# Patient Record
Sex: Female | Born: 1990
Health system: Southern US, Community
[De-identification: ages and names within clinical notes are randomized; demographics above are authoritative.]

## PROBLEM LIST (undated history)

## (undated) ENCOUNTER — Inpatient Hospital Stay (HOSPITAL_COMMUNITY): Payer: Self-pay

## (undated) DIAGNOSIS — R7303 Prediabetes: Secondary | ICD-10-CM

## (undated) DIAGNOSIS — IMO0001 Reserved for inherently not codable concepts without codable children: Secondary | ICD-10-CM

## (undated) DIAGNOSIS — Z789 Other specified health status: Secondary | ICD-10-CM

## (undated) HISTORY — DX: Prediabetes: R73.03

## (undated) HISTORY — PX: NO PAST SURGERIES: SHX2092

---

## 2010-06-05 ENCOUNTER — Inpatient Hospital Stay (HOSPITAL_COMMUNITY)
Admission: AD | Admit: 2010-06-05 | Discharge: 2010-06-08 | Payer: Self-pay | Source: Home / Self Care | Admitting: Obstetrics

## 2010-10-31 LAB — CBC
Hemoglobin: 11 g/dL — ABNORMAL LOW (ref 12.0–15.0)
Platelets: 239 10*3/uL (ref 150–400)
Platelets: 310 10*3/uL (ref 150–400)
RBC: 3.64 MIL/uL — ABNORMAL LOW (ref 3.87–5.11)
RBC: 4.36 MIL/uL (ref 3.87–5.11)
RDW: 15.3 % (ref 11.5–15.5)
WBC: 9.7 10*3/uL (ref 4.0–10.5)

## 2010-10-31 LAB — COMPREHENSIVE METABOLIC PANEL
Alkaline Phosphatase: 339 U/L — ABNORMAL HIGH (ref 39–117)
BUN: 12 mg/dL (ref 6–23)
CO2: 21 mEq/L (ref 19–32)
Chloride: 108 mEq/L (ref 96–112)
Creatinine, Ser: 0.59 mg/dL (ref 0.4–1.2)
GFR calc non Af Amer: >60 mL/min (ref >60)
Glucose, Bld: 81 mg/dL (ref 70–99)
Total Bilirubin: 0.6 mg/dL (ref 0.3–1.2)

## 2010-10-31 LAB — RPR: RPR Ser Ql: NONREACTIVE

## 2010-10-31 LAB — ABO/RH: ABO/RH(D): O POS

## 2012-01-21 ENCOUNTER — Encounter (HOSPITAL_COMMUNITY): Payer: Self-pay | Admitting: Emergency Medicine

## 2012-01-21 ENCOUNTER — Emergency Department (HOSPITAL_COMMUNITY)
Admission: EM | Admit: 2012-01-21 | Discharge: 2012-01-22 | Disposition: A | Discharge status: Home Health | Payer: Self-pay | Attending: Emergency Medicine | Admitting: Emergency Medicine

## 2012-01-21 DIAGNOSIS — R1013 Epigastric pain: Secondary | ICD-10-CM

## 2012-01-21 DIAGNOSIS — R109 Unspecified abdominal pain: Secondary | ICD-10-CM | POA: Insufficient documentation

## 2012-01-21 DIAGNOSIS — IMO0001 Reserved for inherently not codable concepts without codable children: Secondary | ICD-10-CM | POA: Insufficient documentation

## 2012-01-21 DIAGNOSIS — M549 Dorsalgia, unspecified: Secondary | ICD-10-CM | POA: Insufficient documentation

## 2012-01-21 HISTORY — DX: Reserved for inherently not codable concepts without codable children: IMO0001

## 2012-01-21 LAB — URINALYSIS, ROUTINE W REFLEX MICROSCOPIC
Glucose, UA: NEGATIVE mg/dL
Ketones, ur: 15 mg/dL — AB
Nitrite: NEGATIVE
Protein, ur: NEGATIVE mg/dL
pH: 6.5 (ref 5.0–8.0)

## 2012-01-21 NOTE — ED Notes (Signed)
Patient complaining of generalized abdominal pain that radiates around her lower back.  Denies nausea, vomiting, and diarrhea.  Denies changes in urine.

## 2012-01-22 LAB — CBC
HCT: 35.3 % — ABNORMAL LOW (ref 36.0–46.0)
Hemoglobin: 11.7 g/dL — ABNORMAL LOW (ref 12.0–15.0)
MCHC: 33.1 g/dL (ref 30.0–36.0)
RDW: 13.9 % (ref 11.5–15.5)
WBC: 9.4 10*3/uL (ref 4.0–10.5)

## 2012-01-22 LAB — DIFFERENTIAL
Basophils Absolute: 0.1 10*3/uL (ref 0.0–0.1)
Basophils Relative: 1 % (ref 0–1)
Lymphocytes Relative: 36 % (ref 12–46)
Monocytes Absolute: 0.6 10*3/uL (ref 0.1–1.0)
Neutro Abs: 5.2 10*3/uL (ref 1.7–7.7)
Neutrophils Relative %: 56 % (ref 43–77)

## 2012-01-22 LAB — COMPREHENSIVE METABOLIC PANEL
ALT: 22 U/L (ref 0–35)
AST: 24 U/L (ref 0–37)
Albumin: 3.4 g/dL — ABNORMAL LOW (ref 3.5–5.2)
Alkaline Phosphatase: 89 U/L (ref 39–117)
CO2: 24 mEq/L (ref 19–32)
Chloride: 103 mEq/L (ref 96–112)
Creatinine, Ser: 0.53 mg/dL (ref 0.50–1.10)
GFR calc non Af Amer: >90 mL/min (ref >90)
Potassium: 3.7 mEq/L (ref 3.5–5.1)
Total Bilirubin: 0.3 mg/dL (ref 0.3–1.2)

## 2012-01-22 MED ORDER — PANTOPRAZOLE SODIUM 40 MG PO TBEC
40.0000 mg | DELAYED_RELEASE_TABLET | Freq: Once | ORAL | Status: AC
  Administered 2012-01-22: 40 mg via ORAL
  Filled 2012-01-22: qty 1

## 2012-01-22 MED ORDER — GI COCKTAIL ~~LOC~~
30.0000 mL | Freq: Once | ORAL | Status: AC
  Administered 2012-01-22: 30 mL via ORAL
  Filled 2012-01-22: qty 30

## 2012-01-22 MED ORDER — FAMOTIDINE 40 MG PO TABS: 40.0000 mg | ORAL_TABLET | Freq: Every day | ORAL | Status: DC

## 2012-01-22 MED ORDER — FAMOTIDINE 20 MG PO TABS
20.0000 mg | ORAL_TABLET | Freq: Once | ORAL | Status: AC
  Administered 2012-01-22: 20 mg via ORAL
  Filled 2012-01-22: qty 1

## 2012-01-22 NOTE — ED Notes (Signed)
Pt discharged in stable condition with spouse. Discharge instructions reviewed including prescription and importance of making a follow up appt with GI Dr. Rock Nephew diet restrictions also reviewed. Pt questions answered, pt verbalizes understanding of instructions.

## 2012-01-22 NOTE — ED Provider Notes (Signed)
History     CSN: [id number]  Arrival date & time 01/21/12  2321   First MD Initiated Contact with Patient 01/22/12 0015      Chief Complaint  Patient presents with  . Abdominal Pain  . Back Pain    (Consider location/radiation/quality/duration/timing/severity/associated sxs/prior treatment) HPI History provided by patient and her husband bedside. Epigastric pain. Started tonight around 10 PM. She had dinner around 5 PM and does not feel like food was related to her symptoms. Pain is sharp in quality and severe initially some radiation to her mid back. No right upper quadrant pain. No nausea vomiting or diarrhea. No fevers or chills. States she's had this pain before but has not been evaluated for it. No trauma. No belching. Does have some heartburn. No history of gallstones or abdominal surgeries. On my evaluation is feeling significantly better with mild pain. No known aggravating or alleviating factors. Past Medical History  Diagnosis Date  . No significant past medical history     History reviewed. No pertinent past surgical history.  History reviewed. No pertinent family history.  History  Substance Use Topics  . Smoking status: Never Smoker   . Smokeless tobacco: Not on file  . Alcohol Use: No    OB History    Grav Para Term Preterm Abortions TAB SAB Ect Mult Living                  Review of Systems  Constitutional: Negative for fever and chills.  HENT: Negative for neck pain and neck stiffness.   Eyes: Negative for pain.  Respiratory: Negative for shortness of breath.   Cardiovascular: Negative for chest pain.  Gastrointestinal: Positive for abdominal pain. Negative for diarrhea, constipation and blood in stool.  Genitourinary: Negative for dysuria, flank pain, vaginal bleeding and vaginal discharge.  Musculoskeletal: Negative for back pain.  Skin: Negative for rash.  Neurological: Negative for headaches.  All other systems reviewed and are  negative.    Allergies  Review of patient's allergies indicates no known allergies.  Home Medications  No current outpatient prescriptions on file.  BP 93/63  Pulse 89  Temp(Src) 97.9 F (36.6 C) (Oral)  Resp 18  SpO2 99%  Physical Exam  Constitutional: She is oriented to person, place, and time. She appears well-developed and well-nourished.  HENT:  Head: Normocephalic and atraumatic.  Eyes: Conjunctivae and EOM are normal. Pupils are equal, round, and reactive to light.  Neck: Trachea normal. Neck supple. No thyromegaly present.  Cardiovascular: Normal rate, regular rhythm, S1 normal, S2 normal and normal pulses.     No systolic murmur is present   No diastolic murmur is present  Pulses:      Radial pulses are 2+ on the right side, and 2+ on the left side.  Pulmonary/Chest: Effort normal and breath sounds normal. She has no wheezes. She has no rhonchi. She has no rales. She exhibits no tenderness.  Abdominal: Soft. Normal appearance and bowel sounds are normal. There is no tenderness. There is no rebound, no guarding, no CVA tenderness and negative Murphy's sign.       Localizes discomfort epigastric region without reproducible tenderness. No peritonitis  Musculoskeletal:       BLE:s Calves nontender, no cords or erythema, negative Homans sign  Neurological: She is alert and oriented to person, place, and time. She has normal strength. No cranial nerve deficit or sensory deficit. GCS eye subscore is 4. GCS verbal subscore is 5. GCS motor subscore is 6.  Skin: Skin is warm and dry. No rash noted. She is not diaphoretic.  Psychiatric: Her speech is normal.       Cooperative and appropriate    ED Course  Procedures (including critical care time)  Labs Reviewed  URINALYSIS, ROUTINE W REFLEX MICROSCOPIC - Abnormal; Notable for the following:    Color, Urine AMBER (*) BIOCHEMICALS MAY BE AFFECTED BY COLOR   APPearance CLOUDY (*)    Bilirubin Urine SMALL (*)    Ketones, ur  15 (*)    Urobilinogen, UA 2.0 (*)    Leukocytes, UA MODERATE (*)    All other components within normal limits  CBC - Abnormal; Notable for the following:    Hemoglobin 11.7 (*)    HCT 35.3 (*)    All other components within normal limits  COMPREHENSIVE METABOLIC PANEL - Abnormal; Notable for the following:    Glucose, Bld 126 (*)    Albumin 3.4 (*)    All other components within normal limits  URINE MICROSCOPIC-ADD ON - Abnormal; Notable for the following:    Squamous Epithelial / LPF MANY (*)    Bacteria, UA MANY (*)    All other components within normal limits  DIFFERENTIAL  POCT PREGNANCY, URINE    Symptoms suggest gastritis. Doubt acute cholecystitis. UA and labs obtained and reviewed as above. GI cocktail, Pepcid Protonix provided and on recheck at 1:45 AM is feeling significantly better without pain. Serial Donald exams without right upper quadrant tenderness. Negative Murphy sign. No indication for emergent ultrasound to evaluate gallbladder no indication for imaging at this time.  MDM   Nurse's notes reviewed - Patient denies any generalized abdominal pain to me.   Vital signs reviewed. Labs and UA as above. Medications provided with significant relief of symptoms. Plan discharge home with Pepcid and GERD precautions. GI referral provided as needed for any persistent symptoms despite medications.        Sunnie Nielsen, MD 01/22/12 [phone]

## 2012-01-22 NOTE — Discharge Instructions (Signed)
Reflujo gastroesofgico - Adultos  (Gastroesophageal Reflux Disease, Adult)  El reflujo gastroesofgico ocurre cuando el cido del estmago pasa alesfago. El cido produce una sensacin de ardor en el pecho. Con el tiempo, el cido producir hacer pequeas llagas (lceras) en el esfago.  CUIDADOS EN EL HOGAR  Consulte a su mdico para obtener ms informacin sobre:  Curator.  Dejar de fumar.  El consumo de alcohol.  Evite las comidas y bebidas que [employer]. Debe evitar:  Cafena y alcohol.  Chocolate.  Mentas.  Ajo y cebolla.  Comidas muy condimentadas.  Ctricos como naranjas, limones o limas.  Alimentos que contengan tomate, como salsas, [place] y pizza.  Alimentos fritos y Lexicographer.  Evite acostarse durante 3 horas antes de ir a la cama o antes de tomar una siesta.  Haga comidas pequeas durante Glass blower/designer de 3 comidas abundantes.  Use ropas sueltas. No use nada apretado alrededor de la cintura.  Levante (eleve) la cabecera de la cama 6 a 8 pulgadas (15 a 20 cm ) con bloques de madera. Usar almohadas extra no ayuda.  Tome slo los medicamentos que le haya indicado el mdico.  No tome aspirina ni ibuprofeno.  SOLICITE AYUDA DE INMEDIATO SI:  Siente dolor en los brazos, el cuello, la [place], los dientes o la espalda.  El dolor empeora o [place].  Tiene malestar estomacal (nuseas), vmitos, o transpira.  Le falta el aire, se siente mareado o se desvanece (se desmaya).  Vomita y el vmito tiene [place], es de [place], [place], negro o es similar a la borra del caf.  [employer], sanguinolentas o negras.  ASEGRESE DE QUE:  Comprende estas instrucciones.  Controlar su enfermedad.  Solicitar ayuda de inmediato si no mejora o si empeora.

## 2012-08-19 NOTE — L&D Delivery Note (Signed)
Attestation of Attending Supervision of Advanced Practitioner (CNM/NP): Evaluation and management procedures were performed by the Advanced Practitioner under my supervision and collaboration. I have reviewed the Advanced Practitioner's note and chart, and I agree with the management and plan.  Tanya Contreras H. 9:24 AM

## 2012-08-19 NOTE — L&D Delivery Note (Signed)
Delivery Note Pt progressed quickly w/ strong urge to push and at 3:42 AM a viable female was delivered via Vaginal, Spontaneous Delivery (Presentation: Left Occiput Anterior).  APGAR: 9, 9; weight: not available at time of note  .   Placenta status: Intact, Spontaneous.  Cord: 3 vessels with the following complications: None.    Anesthesia: Epidural  Episiotomy: None Lacerations: 1st degree;Perineal Suture Repair: 3.0 monocryl Est. Blood Loss (mL): 250  Mom to postpartum.  Baby to nursery-stable. Plans to breast/bottlefeed, undecided about contraception.  Marge Duncans 05/16/2013, 4:07 AM

## 2013-01-25 ENCOUNTER — Encounter (HOSPITAL_COMMUNITY): Payer: Self-pay | Admitting: *Deleted

## 2013-01-25 ENCOUNTER — Inpatient Hospital Stay (HOSPITAL_COMMUNITY)
Admission: AD | Admit: 2013-01-25 | Discharge: 2013-01-25 | Disposition: A | Discharge status: Home / Self Care | Payer: Self-pay | Source: Ambulatory Visit | Attending: Obstetrics & Gynecology | Admitting: Obstetrics & Gynecology

## 2013-01-25 DIAGNOSIS — N939 Abnormal uterine and vaginal bleeding, unspecified: Secondary | ICD-10-CM

## 2013-01-25 DIAGNOSIS — N898 Other specified noninflammatory disorders of vagina: Secondary | ICD-10-CM

## 2013-01-25 DIAGNOSIS — O26859 Spotting complicating pregnancy, unspecified trimester: Secondary | ICD-10-CM | POA: Insufficient documentation

## 2013-01-25 HISTORY — DX: Other specified health status: Z78.9

## 2013-01-25 LAB — OB RESULTS CONSOLE ABO/RH: RH Type: POSITIVE

## 2013-01-25 LAB — OB RESULTS CONSOLE HEPATITIS B SURFACE ANTIGEN: Hepatitis B Surface Ag: NEGATIVE

## 2013-01-25 LAB — URINALYSIS, ROUTINE W REFLEX MICROSCOPIC
Glucose, UA: NEGATIVE mg/dL
Protein, ur: NEGATIVE mg/dL
Specific Gravity, Urine: <1.005 — ABNORMAL LOW (ref 1.005–1.030)
pH: 6.5 (ref 5.0–8.0)

## 2013-01-25 LAB — URINE MICROSCOPIC-ADD ON

## 2013-01-25 LAB — OB RESULTS CONSOLE ANTIBODY SCREEN: Antibody Screen: NEGATIVE

## 2013-01-25 LAB — POCT PREGNANCY, URINE: Preg Test, Ur: POSITIVE — AB

## 2013-01-25 LAB — OB RESULTS CONSOLE RPR: RPR: NONREACTIVE

## 2013-01-25 NOTE — MAU Note (Signed)
Call received from RN at [facility] Department. Patient was being seen for her first appointment today and with the pelvic exam, bleeding was noted. Patient sent to MAU for evaluation.

## 2013-01-25 NOTE — MAU Provider Note (Signed)
  History     CSN: [id number]  Arrival date and time: 01/25/13 1137   First Provider Initiated Contact with Patient 01/25/13 1223      Chief Complaint  Patient presents with  . Vaginal Bleeding   HPI Tanya Contreras is a 22 y.o. G2P1001 at [redacted]w[redacted]d who presents from the health department today with reports of vaginal bleeding on speculum exam.  MAU received a call from the Health Department today stating that they noticed some bleeding during speculum examination and was sending patient over for further evaluation. Patient reports that she noticed a slight/small amount of blood following Pap smear today.  No other complaints at this time.  No loss of fluid, contractions, vaginal discharge.  She states that she is feeling well.  *Of note, this was her initial [facility] visit at the Health department.   Past Medical History  Diagnosis Date  . No significant past medical history   . Medical history non-contributory     Past Surgical History  Procedure Laterality Date  . No past surgeries      No family history on file.  History  Substance Use Topics  . Smoking status: Never Smoker   . Smokeless tobacco: Not on file  . Alcohol Use: No    Allergies: No Known Allergies  Prescriptions prior to admission  Medication Sig Dispense Refill  . Prenatal Vit-Fe Fumarate-FA (PRENATAL MULTIVITAMIN) TABS Take 1 tablet by mouth daily at 12 noon.        ROS Per HPI Physical Exam   Height 5\' 1"  (1.549 m), weight 61.78 kg (136 lb 3.2 oz), last menstrual period 08/15/2012.  Physical Exam Gen: well appearing, NAD. Abd: gravid but otherwise soft, nontender to palpation Ext: no appreciable lower extremity edema bilaterally Neuro: no focal deficits GU: normal appearing external genitalia Speculum examination: Friable cervix.  Minimal, blood tinged mucous noted.  No gross bleeding or pooling.  Cervix closed.      MAU Course  Procedures EFM: FHR 145-150, moderate variability, 10 bpm accels, no  decels Toco: No UCs  Assessment and Plan  Tanya Contreras is a 22 y.o. G2P1001 at [redacted]w[redacted]d who presents with spotting following pap smear this am. - No significant bleeding noted. - Reassured patient that spotting can occur following pap smear.   - Return instructions discussed. - Patient medically stable and will be discharged home.   Everlene Other 01/25/2013, 12:24 PM   Evaluation and management procedures were performed by Resident physician under my supervision/collaboration. Chart reviewed, patient examined by me and I agree with management and plan. TC to [facility] to confirm cultures done and anatomic [place] scheduled as pt unaware. If done will d/c home on pelvic rest until after [place] and bleeding stops. Will schedule outpatient anatomic [place] for this week.  Danae [place], CNM 01/25/2013 12:41 PM

## 2013-01-25 NOTE — MAU Note (Signed)
Pt sent over from health dept went for first exam and they saw bleeding and sent her over to be examiened. Pt did not know she was bleeding and denies any pain.

## 2013-02-24 ENCOUNTER — Other Ambulatory Visit (HOSPITAL_COMMUNITY): Payer: Self-pay | Admitting: *Deleted

## 2013-02-24 DIAGNOSIS — Z1389 Encounter for screening for other disorder: Secondary | ICD-10-CM

## 2013-02-24 DIAGNOSIS — Z363 Encounter for antenatal screening for malformations: Secondary | ICD-10-CM

## 2013-03-03 ENCOUNTER — Ambulatory Visit (HOSPITAL_COMMUNITY)
Admission: RE | Admit: 2013-03-03 | Discharge: 2013-03-03 | Disposition: A | Discharge status: Home / Self Care | Payer: Self-pay | Source: Ambulatory Visit | Attending: Nurse Practitioner | Admitting: Nurse Practitioner

## 2013-03-03 DIAGNOSIS — Z3689 Encounter for other specified antenatal screening: Secondary | ICD-10-CM | POA: Insufficient documentation

## 2013-03-03 DIAGNOSIS — Z1389 Encounter for screening for other disorder: Secondary | ICD-10-CM

## 2013-03-03 DIAGNOSIS — Z363 Encounter for antenatal screening for malformations: Secondary | ICD-10-CM

## 2013-04-26 LAB — OB RESULTS CONSOLE GBS: GBS: NEGATIVE

## 2013-05-15 ENCOUNTER — Inpatient Hospital Stay (HOSPITAL_COMMUNITY)
Admission: AD | Admit: 2013-05-15 | Discharge: 2013-05-17 | DRG: 775 | Disposition: A | Discharge status: Home / Self Care | Payer: Medicaid Other | Source: Ambulatory Visit | Attending: Obstetrics & Gynecology | Admitting: Obstetrics & Gynecology

## 2013-05-15 DIAGNOSIS — IMO0001 Reserved for inherently not codable concepts without codable children: Secondary | ICD-10-CM

## 2013-05-15 NOTE — MAU Note (Signed)
Contractions since 1200n. Some bloody show.

## 2013-05-16 ENCOUNTER — Inpatient Hospital Stay (HOSPITAL_COMMUNITY): Payer: Medicaid Other | Admitting: Anesthesiology

## 2013-05-16 ENCOUNTER — Encounter (HOSPITAL_COMMUNITY): Payer: Self-pay | Admitting: *Deleted

## 2013-05-16 ENCOUNTER — Encounter (HOSPITAL_COMMUNITY): Payer: Self-pay | Admitting: Anesthesiology

## 2013-05-16 LAB — CBC
HCT: 36.9 % (ref 36.0–46.0)
Hemoglobin: 12.4 g/dL (ref 12.0–15.0)
MCHC: 33.6 g/dL (ref 30.0–36.0)
MCV: 82.6 fL (ref 78.0–100.0)
RDW: 15.9 % — ABNORMAL HIGH (ref 11.5–15.5)
WBC: 10.2 10*3/uL (ref 4.0–10.5)

## 2013-05-16 LAB — TYPE AND SCREEN

## 2013-05-16 MED ORDER — SENNOSIDES-DOCUSATE SODIUM 8.6-50 MG PO TABS
2.0000 | ORAL_TABLET | Freq: Every day | ORAL | Status: DC
  Administered 2013-05-16: 2 via ORAL

## 2013-05-16 MED ORDER — OXYTOCIN 40 UNITS IN LACTATED RINGERS INFUSION - SIMPLE MED
62.5000 mL/h | INTRAVENOUS | Status: DC
  Administered 2013-05-16: 62.5 mL/h via INTRAVENOUS
  Filled 2013-05-16: qty 1000

## 2013-05-16 MED ORDER — EPHEDRINE 5 MG/ML INJ
10.0000 mg | INTRAVENOUS | Status: DC | PRN
  Filled 2013-05-16: qty 2
  Filled 2013-05-16: qty 4

## 2013-05-16 MED ORDER — DIBUCAINE 1 % RE OINT: 1.0000 "application " | TOPICAL_OINTMENT | RECTAL | Status: DC | PRN

## 2013-05-16 MED ORDER — FLEET ENEMA 7-19 GM/118ML RE ENEM: 1.0000 | ENEMA | RECTAL | Status: DC | PRN

## 2013-05-16 MED ORDER — ACETAMINOPHEN 325 MG PO TABS: 650.0000 mg | ORAL_TABLET | ORAL | Status: DC | PRN

## 2013-05-16 MED ORDER — FLEET ENEMA 7-19 GM/118ML RE ENEM: 1.0000 | ENEMA | Freq: Every day | RECTAL | Status: DC | PRN

## 2013-05-16 MED ORDER — ONDANSETRON HCL 4 MG/2ML IJ SOLN: 4.0000 mg | Freq: Four times a day (QID) | INTRAMUSCULAR | Status: DC | PRN

## 2013-05-16 MED ORDER — LACTATED RINGERS IV SOLN
INTRAVENOUS | Status: DC
  Administered 2013-05-16 (×2): via INTRAVENOUS

## 2013-05-16 MED ORDER — LACTATED RINGERS IV SOLN: 500.0000 mL | INTRAVENOUS | Status: DC | PRN

## 2013-05-16 MED ORDER — BENZOCAINE-MENTHOL 20-0.5 % EX AERO
1.0000 "application " | INHALATION_SPRAY | CUTANEOUS | Status: DC | PRN
  Administered 2013-05-16: 1 via TOPICAL
  Filled 2013-05-16 (×2): qty 56

## 2013-05-16 MED ORDER — OXYCODONE-ACETAMINOPHEN 5-325 MG PO TABS
1.0000 | ORAL_TABLET | ORAL | Status: DC | PRN
  Filled 2013-05-16 (×4): qty 1

## 2013-05-16 MED ORDER — ONDANSETRON HCL 4 MG/2ML IJ SOLN: 4.0000 mg | INTRAMUSCULAR | Status: DC | PRN

## 2013-05-16 MED ORDER — OXYTOCIN 40 UNITS IN LACTATED RINGERS INFUSION - SIMPLE MED: 62.5000 mL/h | INTRAVENOUS | Status: DC | PRN

## 2013-05-16 MED ORDER — ONDANSETRON HCL 4 MG PO TABS: 4.0000 mg | ORAL_TABLET | ORAL | Status: DC | PRN

## 2013-05-16 MED ORDER — PRENATAL MULTIVITAMIN CH
1.0000 | ORAL_TABLET | Freq: Every day | ORAL | Status: DC
  Administered 2013-05-16 – 2013-05-17 (×2): 1 via ORAL
  Filled 2013-05-16 (×2): qty 1

## 2013-05-16 MED ORDER — OXYTOCIN BOLUS FROM INFUSION: 500.0000 mL | INTRAVENOUS | Status: DC

## 2013-05-16 MED ORDER — FENTANYL CITRATE 0.05 MG/ML IJ SOLN: 100.0000 ug | INTRAMUSCULAR | Status: DC | PRN

## 2013-05-16 MED ORDER — BISACODYL 10 MG RE SUPP: 10.0000 mg | Freq: Every day | RECTAL | Status: DC | PRN

## 2013-05-16 MED ORDER — SIMETHICONE 80 MG PO CHEW: 80.0000 mg | CHEWABLE_TABLET | ORAL | Status: DC | PRN

## 2013-05-16 MED ORDER — LIDOCAINE HCL (PF) 1 % IJ SOLN
INTRAMUSCULAR | Status: DC | PRN
  Administered 2013-05-16 (×3): 5 mL

## 2013-05-16 MED ORDER — IBUPROFEN 600 MG PO TABS
600.0000 mg | ORAL_TABLET | Freq: Four times a day (QID) | ORAL | Status: DC | PRN
  Filled 2013-05-16 (×6): qty 1

## 2013-05-16 MED ORDER — OXYCODONE-ACETAMINOPHEN 5-325 MG PO TABS
1.0000 | ORAL_TABLET | ORAL | Status: DC | PRN
  Administered 2013-05-16 – 2013-05-17 (×4): 1 via ORAL

## 2013-05-16 MED ORDER — SODIUM CHLORIDE 0.9 % IJ SOLN: 3.0000 mL | INTRAMUSCULAR | Status: DC | PRN

## 2013-05-16 MED ORDER — TETANUS-DIPHTH-ACELL PERTUSSIS 5-2.5-18.5 LF-MCG/0.5 IM SUSP: 0.5000 mL | Freq: Once | INTRAMUSCULAR | Status: DC

## 2013-05-16 MED ORDER — LACTATED RINGERS IV SOLN
500.0000 mL | Freq: Once | INTRAVENOUS | Status: AC
  Administered 2013-05-16: 02:00:00 via INTRAVENOUS

## 2013-05-16 MED ORDER — ZOLPIDEM TARTRATE 5 MG PO TABS: 5.0000 mg | ORAL_TABLET | Freq: Every evening | ORAL | Status: DC | PRN

## 2013-05-16 MED ORDER — SODIUM CHLORIDE 0.9 % IV SOLN: 250.0000 mL | INTRAVENOUS | Status: DC | PRN

## 2013-05-16 MED ORDER — PHENYLEPHRINE 40 MCG/ML (10ML) SYRINGE FOR IV PUSH (FOR BLOOD PRESSURE SUPPORT)
80.0000 ug | PREFILLED_SYRINGE | INTRAVENOUS | Status: DC | PRN
  Filled 2013-05-16: qty 5
  Filled 2013-05-16: qty 2

## 2013-05-16 MED ORDER — SODIUM CHLORIDE 0.9 % IJ SOLN: 3.0000 mL | Freq: Two times a day (BID) | INTRAMUSCULAR | Status: DC

## 2013-05-16 MED ORDER — EPHEDRINE 5 MG/ML INJ
10.0000 mg | INTRAVENOUS | Status: DC | PRN
  Filled 2013-05-16: qty 2

## 2013-05-16 MED ORDER — PHENYLEPHRINE 40 MCG/ML (10ML) SYRINGE FOR IV PUSH (FOR BLOOD PRESSURE SUPPORT)
80.0000 ug | PREFILLED_SYRINGE | INTRAVENOUS | Status: DC | PRN
  Filled 2013-05-16: qty 2

## 2013-05-16 MED ORDER — FENTANYL 2.5 MCG/ML BUPIVACAINE 1/10 % EPIDURAL INFUSION (WH - ANES)
INTRAMUSCULAR | Status: DC | PRN
  Administered 2013-05-16: 14 mL/h via EPIDURAL

## 2013-05-16 MED ORDER — IBUPROFEN 600 MG PO TABS
600.0000 mg | ORAL_TABLET | Freq: Four times a day (QID) | ORAL | Status: DC
  Administered 2013-05-16 – 2013-05-17 (×6): 600 mg via ORAL
  Filled 2013-05-16: qty 1

## 2013-05-16 MED ORDER — HYDROXYZINE HCL 50 MG PO TABS
50.0000 mg | ORAL_TABLET | Freq: Four times a day (QID) | ORAL | Status: DC | PRN
  Filled 2013-05-16: qty 1

## 2013-05-16 MED ORDER — LANOLIN HYDROUS EX OINT: TOPICAL_OINTMENT | CUTANEOUS | Status: DC | PRN

## 2013-05-16 MED ORDER — DIPHENHYDRAMINE HCL 50 MG/ML IJ SOLN: 12.5000 mg | INTRAMUSCULAR | Status: DC | PRN

## 2013-05-16 MED ORDER — LIDOCAINE HCL (PF) 1 % IJ SOLN
30.0000 mL | INTRAMUSCULAR | Status: DC | PRN
  Administered 2013-05-16: 30 mL via SUBCUTANEOUS
  Filled 2013-05-16 (×2): qty 30

## 2013-05-16 MED ORDER — WITCH HAZEL-GLYCERIN EX PADS: 1.0000 "application " | MEDICATED_PAD | CUTANEOUS | Status: DC | PRN

## 2013-05-16 MED ORDER — DIPHENHYDRAMINE HCL 25 MG PO CAPS: 25.0000 mg | ORAL_CAPSULE | Freq: Four times a day (QID) | ORAL | Status: DC | PRN

## 2013-05-16 MED ORDER — FENTANYL 2.5 MCG/ML BUPIVACAINE 1/10 % EPIDURAL INFUSION (WH - ANES)
14.0000 mL/h | INTRAMUSCULAR | Status: DC | PRN
  Filled 2013-05-16: qty 125

## 2013-05-16 MED ORDER — MEASLES, MUMPS & RUBELLA VAC ~~LOC~~ INJ
0.5000 mL | INJECTION | Freq: Once | SUBCUTANEOUS | Status: DC
  Filled 2013-05-16: qty 0.5

## 2013-05-16 MED ORDER — CITRIC ACID-SODIUM CITRATE 334-500 MG/5ML PO SOLN: 30.0000 mL | ORAL | Status: DC | PRN

## 2013-05-16 NOTE — MAU Note (Signed)
Report called to [facility] RN in [facility] regarding pt's assessment and admission.

## 2013-05-16 NOTE — Progress Notes (Signed)
To BS via w/c °

## 2013-05-16 NOTE — Progress Notes (Signed)
Joellyn Haff CNM on unit and aware of pt's admission and status.

## 2013-05-16 NOTE — Anesthesia Postprocedure Evaluation (Signed)
Anesthesia Post Note  Patient: Tanya Contreras  Procedure(s) Performed: * No procedures listed *  Anesthesia type: Epidural  Patient location: Mother/Baby  Post pain: Pain level controlled  Post assessment: Post-op Vital signs reviewed  Last Vitals:  Filed Vitals:   05/16/13 0800  BP: 100/56  Pulse: 80  Temp: 37.3 C  Resp: 18    Post vital signs: Reviewed  Level of consciousness:alert  Complications: No apparent anesthesia complications

## 2013-05-16 NOTE — H&P (Signed)
Tanya Contreras is a 22 y.o. G2P1001 female at [redacted]w[redacted]d by LMP which correlates well w/ 29wk u/s, presenting for uc's that began at 1200 on 9/27 and have progressively increased in frequency/intensity. SVE by MAU RN 4/80/-1, vtx, had been 1 at HD on last exam. Reports good fm, denies vb or lof.  Initiated pnc at [facility] at 23wks. Too late for genetic screening, anatomy u/s normal, 1hr glucola 87, gbs neg. H/O uncomplicated 39wk SVD in 2011, infant weighed 6lb13oz.   Maternal Medical History:  Reason for admission: Contractions.   Contractions: Onset was 6-12 hours ago.   Frequency: regular.   Perceived severity is strong.    Fetal activity: Perceived fetal activity is normal.   Last perceived fetal movement was within the past hour.    Prenatal complications: no prenatal complications Prenatal Complications - Diabetes: none.    OB History   Grav Para Term Preterm Abortions TAB SAB Ect Mult Living   2 1 1       1      Past Medical History  Diagnosis Date  . No significant past medical history   . Medical history non-contributory    Past Surgical History  Procedure Laterality Date  . No past surgeries     Family History: family history is negative for Alcohol abuse. Social History:  reports that she has never smoked. She does not have any smokeless tobacco history on file. She reports that she does not drink alcohol or use illicit drugs.   Review of Systems  Constitutional: Negative.   HENT: Negative.   Eyes: Negative.   Respiratory: Negative.   Cardiovascular: Negative.   Gastrointestinal: Positive for abdominal pain (uc's).  Genitourinary: Negative.   Musculoskeletal: Negative.   Skin: Negative.   Neurological: Negative.   Endo/Heme/Allergies: Negative.   Psychiatric/Behavioral: Negative.     Dilation: 4 Effacement (%): 80 Station: -1 Exam by:: Quintella Baton RNC Blood pressure 107/65, pulse 73, temperature 97.4 F (36.3 C), resp. rate 20, height 5' (1.524 m), weight 69.763 kg  (153 lb 12.8 oz), last menstrual period 08/15/2012. Maternal Exam:  Uterine Assessment: Contraction strength is moderate.  Contraction frequency is regular.   Abdomen: Fetal presentation: vertex     Fetal Exam Fetal Monitor Review: Mode: ultrasound.   Baseline rate: 125.  Variability: moderate (6-25 bpm).   Pattern: accelerations present and no decelerations.    Fetal State Assessment: Category I - tracings are normal.     Physical Exam  Constitutional: She is oriented to person, place, and time. She appears well-developed and well-nourished.  HENT:  Head: Normocephalic.  Neck: Normal range of motion.  Cardiovascular: Normal rate and regular rhythm.   Respiratory: Effort normal and breath sounds normal.  GI: Soft. There is no tenderness.  gravid  Genitourinary:  SVe by MAU RN 4/80/-1, vtx        had been 1 at HD  Musculoskeletal: Normal range of motion.  Neurological: She is alert and oriented to person, place, and time. She has normal reflexes.  Skin: Skin is warm and dry.  Psychiatric: She has a normal mood and affect. Her behavior is normal. Judgment and thought content normal.    Prenatal labs: ABO, Rh: O/Positive/-- (06/09 0000) Antibody: Negative (06/09 0000) Rubella: Immune (06/09 0000) RPR: Nonreactive (06/09 0000)  HBsAg: Negative (06/09 0000)  HIV: Non-reactive (06/09 0000)  GBS: Negative (09/08 0000)   Assessment/Plan: A:  [redacted]w[redacted]d SIUP  G2P1001   Spontaneous labor  Cat I FHR  GBS neg  P:  Admit to BS  IV pain meds/epidural prn   Expectant management  Anticipate NSVD   Marge Duncans 05/16/2013, 12:40 AM

## 2013-05-16 NOTE — Anesthesia Preprocedure Evaluation (Signed)

## 2013-05-16 NOTE — H&P (Signed)
Attestation of Attending Supervision of Advanced Practitioner (CNM/NP): Evaluation and management procedures were performed by the Advanced Practitioner under my supervision and collaboration. I have reviewed the Advanced Practitioner's note and chart, and I agree with the management and plan.  Axell Trigueros H. 7:43 AM   

## 2013-05-16 NOTE — Anesthesia Procedure Notes (Signed)
Epidural Patient location during procedure: OB  Staffing Anesthesiologist: Kyeisha Janowicz Performed by: anesthesiologist   Preanesthetic Checklist Completed: patient identified, site marked, surgical consent, pre-op evaluation, timeout performed, IV checked, risks and benefits discussed and monitors and equipment checked  Epidural Patient position: sitting Prep: ChloraPrep Patient monitoring: heart rate, continuous pulse ox and blood pressure Approach: right paramedian Injection technique: LOR saline  Needle:  Needle type: Tuohy  Needle gauge: 17 G Needle length: 9 cm and 9 Needle insertion depth: 6 cm Catheter type: closed end flexible Catheter size: 20 Guage Catheter at skin depth: 12 cm Test dose: negative  Assessment Events: blood not aspirated, injection not painful, no injection resistance, negative IV test and no paresthesia  Additional Notes   Patient tolerated the insertion well without complications.   

## 2013-05-17 MED ORDER — INFLUENZA VAC SPLIT QUAD 0.5 ML IM SUSP
0.5000 mL | INTRAMUSCULAR | Status: AC
  Administered 2013-05-17: 0.5 mL via INTRAMUSCULAR
  Filled 2013-05-17: qty 0.5

## 2013-05-17 MED ORDER — IBUPROFEN 600 MG PO TABS: 600.0000 mg | ORAL_TABLET | Freq: Four times a day (QID) | ORAL | Status: DC

## 2013-05-17 NOTE — Progress Notes (Signed)
Ur chart review completed.  

## 2013-05-17 NOTE — Discharge Summary (Signed)
Obstetric Discharge Summary Reason for Admission: onset of labor Prenatal Procedures: ultrasound Intrapartum Procedures: spontaneous vaginal delivery Postpartum Procedures: none Complications-Operative and Postpartum: 1st degree perineal laceration Hemoglobin  Date Value Range Status  05/16/2013 12.4  12.0 - 15.0 g/dL Final     HCT  Date Value Range Status  05/16/2013 36.9  36.0 - 46.0 % Final   Ms. Dano was admitted for spontaneous onset of labor. Her labor course was uncomplicated. She received an epidural. Delivery of a viable female with complications of a 1st degree perineal lacerate which was repaired. Postpartum course has been uncomplicated. She is being discharged home on PNVs and ibuprofen.  Physical Exam:  General: alert, cooperative and no distress Lochia: appropriate Uterine Fundus: firm Incision: n/a DVT Evaluation: No evidence of DVT seen on physical exam.  Discharge Diagnoses: Term Pregnancy-delivered  Discharge Information: Date: 05/17/2013 Activity: pelvic rest Diet: routine Medications: PNV and Ibuprofen Condition: stable Instructions: refer to practice specific booklet Discharge to: home   Newborn Data: Live born female  Birth Weight: 7 lb 10 oz (3459 g) APGAR: 9, 9  Home with mother.  Jacquelin Hawking 05/17/2013, 10:46 AM  I have seen and examined this patient and agree with above documentation in the resident's note. Pt is breast and bottle feeding.    Rulon Abide, M.D. [facility] Fellow 05/17/2013 1:23 PM

## 2013-05-17 NOTE — Discharge Summary (Signed)
Attestation of Attending Supervision of Fellow: Evaluation and management procedures were performed by the Fellow under my supervision and collaboration.  I have reviewed the Fellow's note and chart, and I agree with the management and plan.    

## 2014-06-20 ENCOUNTER — Encounter (HOSPITAL_COMMUNITY): Payer: Self-pay | Admitting: *Deleted

## 2014-08-04 ENCOUNTER — Emergency Department (HOSPITAL_COMMUNITY): Payer: Self-pay

## 2014-08-04 ENCOUNTER — Emergency Department (HOSPITAL_COMMUNITY)
Admission: EM | Admit: 2014-08-04 | Discharge: 2014-08-05 | Disposition: A | Discharge status: Home / Self Care | Payer: Self-pay | Attending: Emergency Medicine | Admitting: Emergency Medicine

## 2014-08-04 DIAGNOSIS — K8020 Calculus of gallbladder without cholecystitis without obstruction: Secondary | ICD-10-CM | POA: Insufficient documentation

## 2014-08-04 DIAGNOSIS — R1010 Upper abdominal pain, unspecified: Secondary | ICD-10-CM

## 2014-08-04 DIAGNOSIS — R1011 Right upper quadrant pain: Secondary | ICD-10-CM | POA: Insufficient documentation

## 2014-08-04 DIAGNOSIS — Z3202 Encounter for pregnancy test, result negative: Secondary | ICD-10-CM | POA: Insufficient documentation

## 2014-08-04 DIAGNOSIS — Z79899 Other long term (current) drug therapy: Secondary | ICD-10-CM | POA: Insufficient documentation

## 2014-08-04 LAB — COMPREHENSIVE METABOLIC PANEL
ALBUMIN: 4.1 g/dL (ref 3.5–5.2)
ALT: 25 U/L (ref 0–35)
ANION GAP: 14 (ref 5–15)
AST: 34 U/L (ref 0–37)
Alkaline Phosphatase: 114 U/L (ref 39–117)
BUN: 15 mg/dL (ref 6–23)
CALCIUM: 9.8 mg/dL (ref 8.4–10.5)
CO2: 25 mEq/L (ref 19–32)
CREATININE: 0.55 mg/dL (ref 0.50–1.10)
Chloride: 102 mEq/L (ref 96–112)
GFR calc Af Amer: >90 mL/min (ref >90)
GFR calc non Af Amer: >90 mL/min (ref >90)
Glucose, Bld: 104 mg/dL — ABNORMAL HIGH (ref 70–99)
Potassium: 3.9 mEq/L (ref 3.7–5.3)
Sodium: 141 mEq/L (ref 137–147)
Total Bilirubin: <0.2 mg/dL — ABNORMAL LOW (ref 0.3–1.2)
Total Protein: 8 g/dL (ref 6.0–8.3)

## 2014-08-04 LAB — URINALYSIS, ROUTINE W REFLEX MICROSCOPIC
Bilirubin Urine: NEGATIVE
Glucose, UA: NEGATIVE mg/dL
HGB URINE DIPSTICK: NEGATIVE
Ketones, ur: NEGATIVE mg/dL
Nitrite: NEGATIVE
PH: 6 (ref 5.0–8.0)
Protein, ur: NEGATIVE mg/dL
SPECIFIC GRAVITY, URINE: 1.026 (ref 1.005–1.030)
UROBILINOGEN UA: 0.2 mg/dL (ref 0.0–1.0)

## 2014-08-04 LAB — CBC WITH DIFFERENTIAL/PLATELET
Basophils Absolute: 0 10*3/uL (ref 0.0–0.1)
Basophils Relative: 0 % (ref 0–1)
Eosinophils Absolute: 0.2 10*3/uL (ref 0.0–0.7)
Eosinophils Relative: 2 % (ref 0–5)
HEMATOCRIT: 38.6 % (ref 36.0–46.0)
HEMOGLOBIN: 12.9 g/dL (ref 12.0–15.0)
LYMPHS ABS: 5.2 10*3/uL — AB (ref 0.7–4.0)
LYMPHS PCT: 47 % — AB (ref 12–46)
MCH: 29.5 pg (ref 26.0–34.0)
MCHC: 33.4 g/dL (ref 30.0–36.0)
MCV: 88.3 fL (ref 78.0–100.0)
MONO ABS: 0.8 10*3/uL (ref 0.1–1.0)
MONOS PCT: 7 % (ref 3–12)
NEUTROS ABS: 4.9 10*3/uL (ref 1.7–7.7)
Neutrophils Relative %: 44 % (ref 43–77)
Platelets: 350 10*3/uL (ref 150–400)
RBC: 4.37 MIL/uL (ref 3.87–5.11)
RDW: 13.9 % (ref 11.5–15.5)
WBC: 11.1 10*3/uL — AB (ref 4.0–10.5)

## 2014-08-04 LAB — URINE MICROSCOPIC-ADD ON

## 2014-08-04 LAB — LIPASE, BLOOD: Lipase: 66 U/L — ABNORMAL HIGH (ref 11–59)

## 2014-08-04 LAB — PREGNANCY, URINE: PREG TEST UR: NEGATIVE

## 2014-08-04 MED ORDER — MORPHINE SULFATE 4 MG/ML IJ SOLN
4.0000 mg | Freq: Once | INTRAMUSCULAR | Status: AC
  Administered 2014-08-04: 4 mg via INTRAVENOUS
  Filled 2014-08-04: qty 1

## 2014-08-04 MED ORDER — ONDANSETRON HCL 4 MG/2ML IJ SOLN
4.0000 mg | Freq: Once | INTRAMUSCULAR | Status: AC
  Administered 2014-08-04: 4 mg via INTRAVENOUS
  Filled 2014-08-04: qty 2

## 2014-08-04 MED ORDER — SODIUM CHLORIDE 0.9 % IV BOLUS (SEPSIS)
1000.0000 mL | Freq: Once | INTRAVENOUS | Status: AC
  Administered 2014-08-04: 1000 mL via INTRAVENOUS

## 2014-08-04 NOTE — ED Provider Notes (Signed)
CSN: [id number]     Arrival date & time 08/04/14  2227 History  This chart was scribed for Loren Raceravid Orhan Mayorga, MD by Abel PrestoKara Demonbreun, ED Scribe. This patient was seen in room A09C/A09C and the patient's care was started at 11:08 PM.    Chief Complaint  Patient presents with  . Abdominal Pain   Patient is a 23 y.o. female presenting with abdominal pain. The history is provided by the patient and the spouse. No language interpreter was used.  Abdominal Pain Associated symptoms: no chest pain, no chills, no dysuria, no fever, no nausea, no shortness of breath and no vomiting     HPI Comments: Tanya BoxMaria Contreras is a 23 y.o. female who presents to the Emergency Department complaining of severe abdominal pain with onset 10 minutes PTA.  Pt's pain is in the upper abd and radiates to her back. Pt last ate at 8 pm.  Pt notes she took an ibuprofen today. No SOb or CP. Pt denies nausea and vomiting.  Pt with previously similar symptoms earlier this year. Pt speaks spanish; her husband translated.   Past Medical History  Diagnosis Date  . No significant past medical history   . Medical history non-contributory    Past Surgical History  Procedure Laterality Date  . No past surgeries     Family History  Problem Relation Age of Onset  . Alcohol abuse Neg Hx    History  Substance Use Topics  . Smoking status: Never Smoker   . Smokeless tobacco: Not on file  . Alcohol Use: No   OB History    Gravida Para Term Preterm AB TAB SAB Ectopic Multiple Living   2 2 2       2      Review of Systems  Constitutional: Negative for fever and chills.  Respiratory: Negative for shortness of breath.   Cardiovascular: Negative for chest pain.  Gastrointestinal: Positive for abdominal pain. Negative for nausea and vomiting.  Genitourinary: Negative for dysuria, frequency and flank pain.  Musculoskeletal: Positive for back pain. Negative for neck pain and neck stiffness.  Skin: Negative for rash and wound.   Neurological: Negative for dizziness, weakness, light-headedness, numbness and headaches.  All other systems reviewed and are negative.     Allergies  Review of patient's allergies indicates no known allergies.  Home Medications   Prior to Admission medications   Medication Sig Start Date End Date Taking? Authorizing Provider  HYDROcodone-acetaminophen (NORCO) 5-325 MG per tablet Take 1 tablet by mouth every 4 (four) hours as needed for severe pain. 08/05/14   Loren Raceravid Adisa Litt, MD  ibuprofen (ADVIL,MOTRIN) 600 MG tablet Take 1 tablet (600 mg total) by mouth every 6 (six) hours. Patient not taking: Reported on 08/04/2014 05/17/13   Jacquelin Hawkingalph Nettey, MD  ondansetron (ZOFRAN ODT) 4 MG disintegrating tablet 4mg  ODT q4 hours prn nausea/vomit 08/05/14   Loren Raceravid Kamerin Grumbine, MD  Prenatal Vit-Fe Fumarate-FA (PRENATAL MULTIVITAMIN) TABS Take 1 tablet by mouth daily at 12 noon.    Historical Provider, MD   BP 104/65 mmHg  Pulse 86  Temp(Src) 97.4 F (36.3 C) (Oral)  Resp 27  SpO2 100% Physical Exam  Constitutional: She is oriented to person, place, and time. She appears well-developed and well-nourished.  Pt is uncomfortable, writhing in bed  HENT:  Head: Normocephalic and atraumatic.  Eyes: Conjunctivae and EOM are normal. Pupils are equal, round, and reactive to light.  Neck: Normal range of motion. Neck supple.  Cardiovascular: Normal rate and regular rhythm.  Exam reveals no gallop and no friction rub.   No murmur heard. Pulmonary/Chest: Effort normal and breath sounds normal. No respiratory distress. She has no wheezes. She has no rales. She exhibits no tenderness.  Abdominal: Soft. Bowel sounds are normal. She exhibits no distension. There is tenderness (TTP in epigastrum and RUQ. NO rebound or guarding).  Musculoskeletal: Normal range of motion.  No CVA tenderness bl  Neurological: She is alert and oriented to person, place, and time.  Skin: Skin is warm and dry.  Psychiatric: She has a  normal mood and affect. Her behavior is normal.  Nursing note and vitals reviewed.    ED Course  Procedures (including critical care time) DIAGNOSTIC STUDIES: Oxygen Saturation is 100% on room air, normal by my interpretation.    COORDINATION OF CARE: 11:11 PM Discussed treatment plan with patient at beside, the patient agrees with the plan and has no further questions at this time.   Labs Review Labs Reviewed  COMPREHENSIVE METABOLIC PANEL - Abnormal; Notable for the following:    Glucose, Bld 104 (*)    Total Bilirubin <0.2 (*)    All other components within normal limits  CBC WITH DIFFERENTIAL - Abnormal; Notable for the following:    WBC 11.1 (*)    Lymphocytes Relative 47 (*)    Lymphs Abs 5.2 (*)    All other components within normal limits  LIPASE, BLOOD - Abnormal; Notable for the following:    Lipase 66 (*)    All other components within normal limits  URINALYSIS, ROUTINE W REFLEX MICROSCOPIC - Abnormal; Notable for the following:    APPearance CLOUDY (*)    Leukocytes, UA SMALL (*)    All other components within normal limits  URINE MICROSCOPIC-ADD ON - Abnormal; Notable for the following:    Squamous Epithelial / LPF MANY (*)    Bacteria, UA FEW (*)    All other components within normal limits  PREGNANCY, URINE    Imaging Review US Abdomen Complete  08/05/2014   CLINICAL DATA:  Upper abdominal pain.  EXAM: ULTRASOUND ABDOMEN COMPLETE  COMPARISON:  None.  FINDINGS: Gallbladder: Multiple gallstones are noted without definite gallbladder wall thickening or pericholecystic fluid. No sonographic Murphy's sign is noted.  Common bile duct: Diameter: 5.1 mm which is within normal limits.  Liver: No focal lesion identified. Within normal limits in parenchymal echogenicity.  IVC: No abnormality visualized.  Pancreas: Visualized portion unremarkable.  Spleen: Size and appearance within normal limits.  Right Kidney: Length: 11.1 cm. Echogenicity within normal limits. No mass or  hydronephrosis visualized.  Left Kidney: Length: 11.2 cm. Echogenicity within normal limits. No mass or hydronephrosis visualized.  Abdominal aorta: No aneurysm visualized.  Other findings: None.  IMPRESSION: Cholelithiasis without definite evidence of cholecystitis. No other abnormality seen in the abdomen.   Electronically Signed   By: Roque Lias M.D.   On: 08/05/2014 00:22     EKG Interpretation None      MDM   Final diagnoses:  Upper abdominal pain  Calculus of gallbladder without cholecystitis without obstruction    .I personally performed the services described in this documentation, which was scribed in my presence. The recorded information has been reviewed and is accurate.   Patient's pain is improved after IV morphine. Ultrasound with cholelithiasis but without evidence of cholecystitis. Discussed results at length with patient through translator. All questions answered.  Patient will be discharged to follow-up with central line. She's been given return precautions. She's also been precautioned  about breast-feeding while taking narcotics.  Loren Raceravid Tajuanna Burnett, MD 08/05/14 [phone]

## 2014-08-04 NOTE — ED Notes (Signed)
Pt c/o epigastric pain radiating through to back.  Onset approx. 10 mins prior to coming to ED.  Pt st's last ate at 8pm tonight.

## 2014-08-04 NOTE — ED Notes (Signed)
Patient arrives with complaint of abdominal pain which began about 10 minutes ago. Patient states pain is piercing and she feels it through to her back. States similar episode once before was seen but didn't have imaging done. Denies vomiting. Patient obviously uncomfortable in triage. IV started

## 2014-08-05 ENCOUNTER — Encounter (HOSPITAL_COMMUNITY): Payer: Self-pay | Admitting: Emergency Medicine

## 2014-08-05 MED ORDER — ONDANSETRON 4 MG PO TBDP: ORAL_TABLET | ORAL | Status: DC

## 2014-08-05 MED ORDER — HYDROCODONE-ACETAMINOPHEN 5-325 MG PO TABS: 1.0000 | ORAL_TABLET | ORAL | Status: DC | PRN

## 2014-08-05 MED ORDER — MORPHINE SULFATE 2 MG/ML IJ SOLN
2.0000 mg | Freq: Once | INTRAMUSCULAR | Status: AC
  Administered 2014-08-05: 2 mg via INTRAVENOUS
  Filled 2014-08-05: qty 1

## 2014-08-05 NOTE — Discharge Instructions (Signed)
Colelitiasis (Cholelithiasis) La colelitiasis (tambin llamada clculos en la vescula) es una enfermedad en la que se forman piedras en la vescula. La vescula es un rgano que almacena la bilis que se forma en el hgado y que ayuda a digerir grasas. Los clculos comienzan como pequeos cristales y lentamente se transforman en piedras. El dolor en la vescula ocurre cuando se producen espasmos y los clculos obstruyen el conducto. El dolor tambin se produce cuando una piedra sale por el conducto.  FACTORES DE RIESGO  Ser mujer.   Tener embarazos mltiples. Algunas veces los mdicos aconsejan extirpar los clculos biliares antes de futuros embarazos.   Ser obeso.  Dietas que incluyan comidas fritas y grasas.   Ser mayor de 60 aos y el aumento de la edad.   El uso prolongado de medicamentos que contengan hormonas femeninas.   Tener diabetes mellitus.   Prdida rpida de peso.   Historia familiar de clculos (herencia).  SNTOMAS  Nuseas.   Vmitos.  Dolor abdominal.   Piel amarilla (ictericia)   Dolor sbito. Puede persistir desde algunos minutos hasta algunas horas.  Fiebre.   Sensibilidad al tacto. En algunos casos, cuando los clculos biliares no se mueven hacia el conducto biliar, las personas no sienten dolor ni presentan sntomas. Estos se denominan clculos "silenciosos".  TRATAMIENTO Los clculos silenciosos no requieren tratamiento. En los casos graves, podr ser necesaria una ciruga de urgencia. Las opciones de tratamiento son:  Ciruga para extirpar la vescula. Es el tratamiento ms frecuente.  Medicamentos. No siempre dan resultado y pueden demorar entre 6 y 12 meses o ms en hacer efecto.  Tratamiento con ondas de choque (litotricia biliar extracorporal). En este tratamiento, una mquina de ultrasonido enva ondas de choque a la vescula para destruir los clculos en pequeos fragmentos que luego podrn pasar a los intestinos o ser disueltas  con medicamentos. INSTRUCCIONES PARA EL CUIDADO EN EL HOGAR   Slo tome medicamentos de venta libre o recetados para calmar el dolor, el malestar o bajar la fiebre, segn las indicaciones de su mdico.   Siga una dieta baja en grasas hasta que su mdico lo vea nuevamente. Las grasas hacen que la vescula se contraiga, lo que puede producir dolor.   Concurra a las consultas de control con su mdico segn las indicaciones. Los ataques casi siempre son recurrentes y generalmente habr que someterse a una ciruga como tratamiento permanente.  SOLICITE ATENCIN MDICA DE INMEDIATO SI:   El dolor aumenta y no puede controlarlo con los medicamentos.   Tiene fiebre o sntomas persistentes durante ms de 2 - 3 das.   Tiene fiebre y los sntomas empeoran repentinamente.   Tiene nuseas o vmitos persistentes.  ASEGRESE DE QUE:   Comprende estas instrucciones.  Controlar su afeccin.  Recibir ayuda de inmediato si no mejora o si empeora. Document Released: 05/22/2006 Document Revised: 04/07/2013 [employer] Patient Information 2015 [employer]. This information is not intended to replace advice given to you by your health care provider. Make sure you discuss any questions you have with your health care provider.  

## 2014-08-23 ENCOUNTER — Ambulatory Visit: Payer: Self-pay | Admitting: General Surgery

## 2014-08-31 ENCOUNTER — Emergency Department (HOSPITAL_COMMUNITY): Admission: EM | Admit: 2014-08-31 | Discharge: 2014-08-31 | Discharge status: Absent without leave | Payer: Self-pay

## 2014-09-09 ENCOUNTER — Other Ambulatory Visit (HOSPITAL_COMMUNITY): Payer: Self-pay

## 2014-09-15 ENCOUNTER — Encounter (HOSPITAL_COMMUNITY)
Admission: RE | Admit: 2014-09-15 | Discharge: 2014-09-15 | Disposition: A | Discharge status: Home / Self Care | Payer: Self-pay | Source: Ambulatory Visit | Attending: General Surgery | Admitting: General Surgery

## 2014-09-15 ENCOUNTER — Encounter (HOSPITAL_COMMUNITY): Payer: Self-pay

## 2014-09-15 LAB — CBC WITH DIFFERENTIAL/PLATELET
BASOS ABS: 0 10*3/uL (ref 0.0–0.1)
Basophils Relative: 0 % (ref 0–1)
EOS PCT: 1 % (ref 0–5)
Eosinophils Absolute: 0.1 10*3/uL (ref 0.0–0.7)
HEMATOCRIT: 41.1 % (ref 36.0–46.0)
HEMOGLOBIN: 13.8 g/dL (ref 12.0–15.0)
Lymphocytes Relative: 32 % (ref 12–46)
Lymphs Abs: 2.3 10*3/uL (ref 0.7–4.0)
MCH: 29.4 pg (ref 26.0–34.0)
MCHC: 33.6 g/dL (ref 30.0–36.0)
MCV: 87.6 fL (ref 78.0–100.0)
MONO ABS: 0.5 10*3/uL (ref 0.1–1.0)
MONOS PCT: 7 % (ref 3–12)
NEUTROS ABS: 4.2 10*3/uL (ref 1.7–7.7)
NEUTROS PCT: 60 % (ref 43–77)
PLATELETS: 361 10*3/uL (ref 150–400)
RBC: 4.69 MIL/uL (ref 3.87–5.11)
RDW: 13.7 % (ref 11.5–15.5)
WBC: 7.1 10*3/uL (ref 4.0–10.5)

## 2014-09-15 LAB — COMPREHENSIVE METABOLIC PANEL
ALBUMIN: 4.5 g/dL (ref 3.5–5.2)
ALT: 19 U/L (ref 0–35)
AST: 19 U/L (ref 0–37)
Alkaline Phosphatase: 90 U/L (ref 39–117)
Anion gap: 7 (ref 5–15)
BUN: 12 mg/dL (ref 6–23)
CO2: 28 mmol/L (ref 19–32)
CREATININE: 0.54 mg/dL (ref 0.50–1.10)
Calcium: 9.6 mg/dL (ref 8.4–10.5)
Chloride: 106 mmol/L (ref 96–112)
GFR calc non Af Amer: >90 mL/min (ref >90)
Glucose, Bld: 86 mg/dL (ref 70–99)
Potassium: 3.8 mmol/L (ref 3.5–5.1)
SODIUM: 141 mmol/L (ref 135–145)
Total Bilirubin: 0.8 mg/dL (ref 0.3–1.2)
Total Protein: 8.1 g/dL (ref 6.0–8.3)

## 2014-09-15 MED ORDER — DEXTROSE 5 % IV SOLN
2.0000 g | INTRAVENOUS | Status: AC
  Administered 2014-09-16: 2 g via INTRAVENOUS
  Filled 2014-09-15: qty 2

## 2014-09-15 NOTE — H&P (Signed)
Shalona A. Lye 08/23/2014 10:10 AM Location: [facility] Patient #: [id number] DOB: 1991-07-14 Married / Language: Spanish / Race: Undefined Female  History of Present Illness Lamar Laundry Bynum CMA; 08/23/2014 10:12 AM) Patient words: post-op.  The patient is a 24 year old female    Other Problems Gilmer Mor, CMA; 08/23/2014 10:11 AM) Back Pain Cholelithiasis  Past Surgical History Gilmer Mor, CMA; 08/23/2014 10:11 AM) No pertinent past surgical history  Diagnostic Studies History Gilmer Mor, CMA; 08/23/2014 10:10 AM) Colonoscopy never Mammogram never Pap Smear 1-5 years ago  Allergies Lamar Laundry Bynum, CMA; 08/23/2014 10:12 AM) No Known Drug Allergies01/12/2014  Medication History Gilmer Mor, CMA; 08/23/2014 10:12 AM) No Current Medications  Social History Gilmer Mor, CMA; 08/23/2014 10:11 AM) No alcohol use No caffeine use No drug use Tobacco use Never smoker.  Family History Gilmer Mor, CMA; 08/23/2014 10:11 AM) First Degree Relatives No pertinent family history  Pregnancy / Birth History Gilmer Mor, CMA; 08/23/2014 10:11 AM) Age at menarche 12 years. Contraceptive History Intrauterine device. Gravida 2 Irregular periods Maternal age 52-20 Para 2  Review of Systems Lamar Laundry Bynum CMA; 08/23/2014 10:11 AM) General Present- Fatigue and Fever. Not Present- Appetite Loss, Chills, Night Sweats, Weight Gain and Weight Loss. Skin Not Present- Change in Wart/Mole, Dryness, Hives, Jaundice, New Lesions, Non-Healing Wounds, Rash and Ulcer. HEENT Present- Visual Disturbances. Not Present- Earache, Hearing Loss, Hoarseness, Nose Bleed, Oral Ulcers, Ringing in the Ears, Seasonal Allergies, Sinus Pain, Sore Throat, Wears glasses/contact lenses and Yellow Eyes. Respiratory Not Present- Bloody sputum, Chronic Cough, Difficulty Breathing, Snoring and Wheezing. Breast Not Present- Breast Mass, Breast Pain, Nipple Discharge and Skin Changes. Cardiovascular Not  Present- Chest Pain, Difficulty Breathing Lying Down, Leg Cramps, Palpitations, Rapid Heart Rate, Shortness of Breath and Swelling of Extremities. Gastrointestinal Present- Abdominal Pain, Bloating and Constipation. Not Present- Bloody Stool, Change in Bowel Habits, Chronic diarrhea, Difficulty Swallowing, Excessive gas, Gets full quickly at meals, Hemorrhoids, Indigestion, Nausea, Rectal Pain and Vomiting. Female Genitourinary Present- Pelvic Pain. Not Present- Frequency, Nocturia, Painful Urination and Urgency. Musculoskeletal Present- Back Pain. Not Present- Joint Pain, Joint Stiffness, Muscle Pain, Muscle Weakness and Swelling of Extremities. Neurological Not Present- Decreased Memory, Fainting, Headaches, Numbness, Seizures, Tingling, Tremor, Trouble walking and Weakness. Psychiatric Present- Change in Sleep Pattern. Not Present- Anxiety, Bipolar, Depression, Fearful and Frequent crying. Endocrine Not Present- Cold Intolerance, Excessive Hunger, Hair Changes, Heat Intolerance, Hot flashes and New Diabetes. Hematology Present- Easy Bruising. Not Present- Excessive bleeding, Gland problems, HIV and Persistent Infections.   Vitals (Sonya Bynum CMA; 08/23/2014 10:11 AM) 08/23/2014 10:11 AM Weight: 137 lb Height: 61in Body Surface Area: 1.64 m Body Mass Index: 25.89 kg/m Temp.: 42F(Temporal)  Pulse: 60 (Regular)  BP: 110/70 (Sitting, Left Arm, Standard)    Physical Exam (Nobie Alleyne O. Lindie Spruce MD; 08/23/2014 10:49 AM) General Mental Status-Alert. General Appearance-Cooperative and Well groomed. Orientation-Oriented X4. Build & Nutrition-Well nourished.  Chest and Lung Exam Chest and lung exam reveals -normal excursion with symmetric chest walls, normal resonance, no flatness or dullness and on auscultation, normal breath sounds, no adventitious sounds and normal vocal resonance.  Cardiovascular Cardiovascular examination reveals -on palpation PMI is normal in location and  amplitude, no palpable S3 or S4. Normal cardiac borders., normal heart sounds, regular rate and rhythm with no murmurs and femoral artery auscultation bilaterally reveals normal pulses, no bruits, no thrills.  Abdomen Palpation/Percussion Tenderness - Epigastrium, Right Lower Quadrant and Right Upper Quadrant. Auscultation Auscultation of the abdomen reveals - Bowel sounds normal.  Assessment & Plan Fayrene Fearing(Aayla Marrocco O. Donique Hammonds MD; 08/23/2014 10:51 AM) ACUTE CHOLECYSTITIS WITH CHRONIC CHOLECYSTITIS (575.12  K81.2) Story: RUQ and epigastric abdominal pain for over one year with nausea and vomiting. Has been seen in ED. Last time in December. On chronic pain medication. Impression: Acute on chronic cholecytitis with cholelithiasis Plan to go to the OR for lap chole in the near future    Signed by Cherylynn RidgesJames O Orell Hurtado, MD (08/23/2014 10:53 AM)

## 2014-09-15 NOTE — Pre-Procedure Instructions (Signed)
Tanya BoxMaria Contreras  09/15/2014   Your procedure is scheduled on:  09-16-2014   Friday   [facility] Admitting at 5:30  AM.  Call this number if you have problems the morning of surgery: [phone]   Remember:   Do not eat food or drink liquids after midnight.    Take these medicines the morning of surgery with A SIP OF WATER: pain medication if needed,ondansetron(Zofran) if needed   Do not wear jewelry, make-up or nail polish.  Do not wear lotions, powders, or perfumes. You may not wear deodorant.  Do not shave 48 hours prior to surgery.   Do not bring valuables to the hospital.  [facility] not [facility] for any belongings or valuables.               Contacts, dentures or bridgework may not be worn into surgery.     For patients admitted to the hospital, discharge time is determined by your   treatment team.               Patients discharged the day of surgery will not be allowed to drive  home.  Name and phone number of your driver:    Special Instructions: See attached sheet for instructions on CHG shower   Please read over the following fact sheets that you were given: Pain Booklet and Surgical Site Infection Prevention

## 2014-09-16 ENCOUNTER — Ambulatory Visit (HOSPITAL_COMMUNITY): Payer: Self-pay | Admitting: Anesthesiology

## 2014-09-16 ENCOUNTER — Ambulatory Visit (HOSPITAL_COMMUNITY): Payer: Self-pay

## 2014-09-16 ENCOUNTER — Encounter (HOSPITAL_COMMUNITY): Payer: Self-pay | Admitting: *Deleted

## 2014-09-16 ENCOUNTER — Ambulatory Visit (HOSPITAL_COMMUNITY)
Admission: RE | Admit: 2014-09-16 | Discharge: 2014-09-16 | Disposition: A | Discharge status: Home / Self Care | Payer: Self-pay | Source: Ambulatory Visit | Attending: General Surgery | Admitting: General Surgery

## 2014-09-16 ENCOUNTER — Encounter (HOSPITAL_COMMUNITY)
Admission: RE | Disposition: A | Discharge status: Home / Self Care | Payer: Self-pay | Source: Ambulatory Visit | Attending: General Surgery

## 2014-09-16 DIAGNOSIS — K8012 Calculus of gallbladder with acute and chronic cholecystitis without obstruction: Secondary | ICD-10-CM | POA: Insufficient documentation

## 2014-09-16 DIAGNOSIS — K8020 Calculus of gallbladder without cholecystitis without obstruction: Secondary | ICD-10-CM

## 2014-09-16 HISTORY — PX: CHOLECYSTECTOMY: SHX55

## 2014-09-16 LAB — HCG, SERUM, QUALITATIVE: Preg, Serum: NEGATIVE

## 2014-09-16 SURGERY — LAPAROSCOPIC CHOLECYSTECTOMY WITH INTRAOPERATIVE CHOLANGIOGRAM
Anesthesia: General | Site: Abdomen

## 2014-09-16 MED ORDER — GLYCOPYRROLATE 0.2 MG/ML IJ SOLN
INTRAMUSCULAR | Status: DC | PRN
  Administered 2014-09-16: .4 mg via INTRAVENOUS

## 2014-09-16 MED ORDER — CHLORHEXIDINE GLUCONATE 4 % EX LIQD
1.0000 "application " | Freq: Once | CUTANEOUS | Status: DC
  Filled 2014-09-16: qty 15

## 2014-09-16 MED ORDER — HYDROMORPHONE HCL 1 MG/ML IJ SOLN
INTRAMUSCULAR | Status: AC
  Filled 2014-09-16: qty 1

## 2014-09-16 MED ORDER — BUPIVACAINE-EPINEPHRINE 0.25% -1:200000 IJ SOLN
INTRAMUSCULAR | Status: DC | PRN
  Administered 2014-09-16: 18 mL

## 2014-09-16 MED ORDER — BUPIVACAINE-EPINEPHRINE (PF) 0.25% -1:200000 IJ SOLN
INTRAMUSCULAR | Status: AC
  Filled 2014-09-16: qty 30

## 2014-09-16 MED ORDER — LACTATED RINGERS IV SOLN
INTRAVENOUS | Status: DC | PRN
  Administered 2014-09-16 (×2): via INTRAVENOUS

## 2014-09-16 MED ORDER — FENTANYL CITRATE 0.05 MG/ML IJ SOLN
INTRAMUSCULAR | Status: DC | PRN
  Administered 2014-09-16 (×6): 50 ug via INTRAVENOUS

## 2014-09-16 MED ORDER — HYDROMORPHONE HCL 1 MG/ML IJ SOLN
0.2500 mg | INTRAMUSCULAR | Status: DC | PRN
  Administered 2014-09-16 (×2): 0.5 mg via INTRAVENOUS

## 2014-09-16 MED ORDER — PROPOFOL 10 MG/ML IV BOLUS
INTRAVENOUS | Status: DC | PRN
  Administered 2014-09-16: 150 mg via INTRAVENOUS

## 2014-09-16 MED ORDER — DEXAMETHASONE SODIUM PHOSPHATE 4 MG/ML IJ SOLN
INTRAMUSCULAR | Status: DC | PRN
  Administered 2014-09-16: 4 mg via INTRAVENOUS

## 2014-09-16 MED ORDER — ROCURONIUM BROMIDE 50 MG/5ML IV SOLN
INTRAVENOUS | Status: AC
  Filled 2014-09-16: qty 1

## 2014-09-16 MED ORDER — FENTANYL CITRATE 0.05 MG/ML IJ SOLN
INTRAMUSCULAR | Status: AC
  Filled 2014-09-16: qty 5

## 2014-09-16 MED ORDER — LIDOCAINE HCL (CARDIAC) 20 MG/ML IV SOLN
INTRAVENOUS | Status: DC | PRN
  Administered 2014-09-16: 100 mg via INTRAVENOUS

## 2014-09-16 MED ORDER — 0.9 % SODIUM CHLORIDE (POUR BTL) OPTIME
TOPICAL | Status: DC | PRN
  Administered 2014-09-16: 1000 mL

## 2014-09-16 MED ORDER — EPHEDRINE SULFATE 50 MG/ML IJ SOLN
INTRAMUSCULAR | Status: AC
  Filled 2014-09-16: qty 1

## 2014-09-16 MED ORDER — MEPERIDINE HCL 25 MG/ML IJ SOLN: 6.2500 mg | INTRAMUSCULAR | Status: DC | PRN

## 2014-09-16 MED ORDER — SODIUM CHLORIDE 0.9 % IJ SOLN
INTRAMUSCULAR | Status: AC
  Filled 2014-09-16: qty 10

## 2014-09-16 MED ORDER — PROPOFOL 10 MG/ML IV BOLUS
INTRAVENOUS | Status: AC
  Filled 2014-09-16: qty 20

## 2014-09-16 MED ORDER — ONDANSETRON HCL 4 MG/2ML IJ SOLN
4.0000 mg | Freq: Once | INTRAMUSCULAR | Status: AC | PRN
  Administered 2014-09-16: 4 mg via INTRAVENOUS

## 2014-09-16 MED ORDER — ARTIFICIAL TEARS OP OINT
TOPICAL_OINTMENT | OPHTHALMIC | Status: DC | PRN
  Administered 2014-09-16: 1 via OPHTHALMIC

## 2014-09-16 MED ORDER — SODIUM CHLORIDE 0.9 % IR SOLN
Status: DC | PRN
  Administered 2014-09-16: 1000 mL

## 2014-09-16 MED ORDER — ROCURONIUM BROMIDE 100 MG/10ML IV SOLN
INTRAVENOUS | Status: DC | PRN
  Administered 2014-09-16: 40 mg via INTRAVENOUS

## 2014-09-16 MED ORDER — MIDAZOLAM HCL 2 MG/2ML IJ SOLN
INTRAMUSCULAR | Status: AC
  Filled 2014-09-16: qty 2

## 2014-09-16 MED ORDER — ONDANSETRON HCL 4 MG/2ML IJ SOLN
INTRAMUSCULAR | Status: DC | PRN
  Administered 2014-09-16: 4 mg via INTRAVENOUS

## 2014-09-16 MED ORDER — ONDANSETRON HCL 4 MG/2ML IJ SOLN
INTRAMUSCULAR | Status: AC
  Filled 2014-09-16: qty 2

## 2014-09-16 MED ORDER — HYDROCODONE-ACETAMINOPHEN 5-325 MG PO TABS: 1.0000 | ORAL_TABLET | ORAL | Status: DC | PRN

## 2014-09-16 MED ORDER — NEOSTIGMINE METHYLSULFATE 10 MG/10ML IV SOLN
INTRAVENOUS | Status: DC | PRN
  Administered 2014-09-16: 3 mg via INTRAVENOUS

## 2014-09-16 MED ORDER — SODIUM CHLORIDE 0.9 % IV SOLN
INTRAVENOUS | Status: DC | PRN
  Administered 2014-09-16: 4 mL

## 2014-09-16 MED ORDER — SUCCINYLCHOLINE CHLORIDE 20 MG/ML IJ SOLN
INTRAMUSCULAR | Status: AC
  Filled 2014-09-16: qty 1

## 2014-09-16 MED ORDER — LIDOCAINE HCL (CARDIAC) 20 MG/ML IV SOLN
INTRAVENOUS | Status: AC
  Filled 2014-09-16: qty 5

## 2014-09-16 SURGICAL SUPPLY — 48 items
APPLIER CLIP 5 13 M/L LIGAMAX5 (MISCELLANEOUS) ×3
BLADE SURG ROTATE 9660 (MISCELLANEOUS) IMPLANT
CANISTER SUCTION 2500CC (MISCELLANEOUS) ×3 IMPLANT
CHLORAPREP W/TINT 26ML (MISCELLANEOUS) ×3 IMPLANT
CLIP APPLIE 5 13 M/L LIGAMAX5 (MISCELLANEOUS) ×1 IMPLANT
CLOSURE WOUND 1/2 X4 (GAUZE/BANDAGES/DRESSINGS) ×1
COVER MAYO STAND STRL (DRAPES) ×3 IMPLANT
COVER SURGICAL LIGHT HANDLE (MISCELLANEOUS) ×3 IMPLANT
DRAPE C-ARM 42X72 X-RAY (DRAPES) ×3 IMPLANT
DRAPE LAPAROSCOPIC ABDOMINAL (DRAPES) ×3 IMPLANT
DRSG TEGADERM 2-3/8X2-3/4 SM (GAUZE/BANDAGES/DRESSINGS) ×9 IMPLANT
DRSG TEGADERM 4X4.75 (GAUZE/BANDAGES/DRESSINGS) ×3 IMPLANT
ELECT REM PT RETURN 9FT ADLT (ELECTROSURGICAL) ×3
ELECTRODE REM PT RTRN 9FT ADLT (ELECTROSURGICAL) ×1 IMPLANT
GLOVE BIO SURGEON STRL SZ7 (GLOVE) ×3 IMPLANT
GLOVE BIO SURGEON STRL SZ7.5 (GLOVE) ×3 IMPLANT
GLOVE BIOGEL PI IND STRL 7.0 (GLOVE) ×2 IMPLANT
GLOVE BIOGEL PI IND STRL 7.5 (GLOVE) ×1 IMPLANT
GLOVE BIOGEL PI IND STRL 8 (GLOVE) ×2 IMPLANT
GLOVE BIOGEL PI INDICATOR 7.0 (GLOVE) ×4
GLOVE BIOGEL PI INDICATOR 7.5 (GLOVE) ×2
GLOVE BIOGEL PI INDICATOR 8 (GLOVE) ×4
GLOVE ECLIPSE 7.5 STRL STRAW (GLOVE) ×3 IMPLANT
GLOVE SURG SS PI 7.5 STRL IVOR (GLOVE) ×3 IMPLANT
GOWN STRL REUS W/ TWL LRG LVL3 (GOWN DISPOSABLE) ×3 IMPLANT
GOWN STRL REUS W/ TWL XL LVL3 (GOWN DISPOSABLE) ×1 IMPLANT
GOWN STRL REUS W/TWL 2XL LVL3 (GOWN DISPOSABLE) ×3 IMPLANT
GOWN STRL REUS W/TWL LRG LVL3 (GOWN DISPOSABLE) ×6
GOWN STRL REUS W/TWL XL LVL3 (GOWN DISPOSABLE) ×2
KIT BASIN OR (CUSTOM PROCEDURE TRAY) ×3 IMPLANT
KIT ROOM TURNOVER OR (KITS) ×3 IMPLANT
LIQUID BAND (GAUZE/BANDAGES/DRESSINGS) ×3 IMPLANT
NS IRRIG 1000ML POUR BTL (IV SOLUTION) ×3 IMPLANT
PAD ARMBOARD 7.5X6 YLW CONV (MISCELLANEOUS) ×3 IMPLANT
POUCH SPECIMEN RETRIEVAL 10MM (ENDOMECHANICALS) ×3 IMPLANT
SCISSORS LAP 5X35 DISP (ENDOMECHANICALS) ×3 IMPLANT
SET CHOLANGIOGRAPH 5 50 .035 (SET/KITS/TRAYS/PACK) ×3 IMPLANT
SET IRRIG TUBING LAPAROSCOPIC (IRRIGATION / IRRIGATOR) ×3 IMPLANT
SLEEVE ENDOPATH XCEL 5M (ENDOMECHANICALS) ×6 IMPLANT
SPECIMEN JAR MEDIUM (MISCELLANEOUS) ×3 IMPLANT
STRIP CLOSURE SKIN 1/2X4 (GAUZE/BANDAGES/DRESSINGS) ×2 IMPLANT
SUT MNCRL AB 4-0 PS2 18 (SUTURE) ×3 IMPLANT
TOWEL OR 17X24 6PK STRL BLUE (TOWEL DISPOSABLE) IMPLANT
TOWEL OR 17X26 10 PK STRL BLUE (TOWEL DISPOSABLE) ×3 IMPLANT
TRAY LAPAROSCOPIC (CUSTOM PROCEDURE TRAY) ×3 IMPLANT
TROCAR XCEL BLUNT TIP 100MML (ENDOMECHANICALS) ×3 IMPLANT
TROCAR XCEL NON-BLD 5MMX100MML (ENDOMECHANICALS) ×3 IMPLANT
TUBING INSUFFLATION (TUBING) ×3 IMPLANT

## 2014-09-16 NOTE — Op Note (Signed)
OPERATIVE REPORT  DATE OF OPERATION: 09/16/2014  PATIENT:  Tanya Contreras  24 y.o. female  PRE-OPERATIVE DIAGNOSIS:  Cholelithiasis with acute on chronic cholecystitis  POST-OPERATIVE DIAGNOSIS:  Cholelithiasis with acute on chronic cholecystitis  PROCEDURE:  Procedure(s): LAPAROSCOPIC CHOLECYSTECTOMY WITH INTRAOPERATIVE CHOLANGIOGRAM  SURGEON:  Surgeon(s): Frederik SchmidtJay Vyom Brass, MD  ASSISTANT: Eveland, PA-S  ANESTHESIA:   general  EBL: <10 ml  BLOOD ADMINISTERED: none  DRAINS: none   SPECIMEN:  Source of Specimen:  Gallbladder and stones  COUNTS CORRECT:  YES  PROCEDURE DETAILS:  The patient was taken to the operating room and placed on the table in the supine position.  After an adequate endotracheal anesthetic was administered, the patient was prepped with ChloroPrep, and then draped in the usual manner exposing the entire abdomen laterally, inferiorly and up  to the costal margins.  After a proper timeout was performed including identifying the patient and the procedure to be performed, a supraumbilical 1.5cm midline incision was made using a #15 blade.  This was taken down to the fascia which was then incised with a #15 blade.  The edges of the fascia were tented up with Kocher clamps as the preperitoneal space was penetrated with a Kelly clamp into the peritoneum.  Once this was done, a pursestring suture of 0 Vicryl was passed around the fascial opening.  This was subsequently used to secure the [facility] cannula which was passed into the peritoneal cavity.  Once the [facility] cannula was in place, carbon dioxide gas was insufflated into the peritoneal cavity up to a maximal intra-abdominal pressure of 15mm Hg.The laparoscope, with attached camera and light source, was passed into the peritoneal cavity to visualize the direct insertion of two right upper quadrant 5mm cannulas, and a sup-xiphoid 5mm cannula.  Once all cannulas were in place, the dissection was begun.  Two ratcheted graspers  were attached to the dome and infundibulum of the gallbladder and retracted towards the anterior abdominal wall and the right upper quadrant.  Using cautery attached to a dissecting forceps, the peritoneum overlaying the triangle of Chalot and the hepatoduodenal triangle was dissected away exposing the cystic duct and the cystic artery.  The cystic artery was clipped proximally and distally then transected.  A clip was placed on the gallbladder side of the cystic duct, then a cholecystodochotomy made using the laparoscopic scissors.  Through the cholecystodochotomy a Cook catheter was passed to performed a cholangiogram.  The cholangiogram showed good flow into the duodenum, no ductal dilatation, good proximal filling, and no intraductal filling defects..  Once the cholangiogram was completed, the [facility] catheter was removed, and the distal cystic duct was clipped multiple times then transected.  The gallbladder was then dissected out of the hepatic bed without event.  It was retrieved from the abdomen (using an EndoCatch bag) without event.  Once the gallbladder was removed, the bed was inspected for hemostasis.  Once excellent hemostasis was obtained all gas and fluids were aspirated from above the liver, then the cannulas were removed.  The supraumbilical incision was closed using the pursestring suture which was in place.  0.25% bupivicaine with epinephrine was injected at all sites.  All 10mm or greater cannula sites were close using a running subcuticular stitch of 4-0 Monocryl.  5.730mm cannula sites were closed with Dermabond only.Steri-Strips and Tagaderm were used to complete the dressings at all sites.  At this point all needle, sponge, and instrument counts were correct.The patient was awakened from anesthesia and taken to the PACU in  stable condition.  PATIENT DISPOSITION:  PACU - hemodynamically stable.   [name], JAY 1/29/20168:29 AM

## 2014-09-16 NOTE — Transfer of Care (Signed)
Immediate Anesthesia Transfer of Care Note  Patient: Tanya Contreras  Procedure(s) Performed: Procedure(s): LAPAROSCOPIC CHOLECYSTECTOMY WITH INTRAOPERATIVE CHOLANGIOGRAM (N/A)  Patient Location: PACU  Anesthesia Type:General  Level of Consciousness: awake and alert   Airway & Oxygen Therapy: Patient Spontanous Breathing and Patient connected to nasal cannula oxygen  Post-op Assessment: Report given to RN, Post -op Vital signs reviewed and stable and Patient moving all extremities X 4  Post vital signs: Reviewed and stable  Last Vitals:  Filed Vitals:   09/16/14 0549  BP: 94/62  Pulse: 86  Temp: 36.7 C  Resp: 18    Complications: No apparent anesthesia complications

## 2014-09-16 NOTE — Anesthesia Procedure Notes (Signed)
Procedure Name: Intubation Date/Time: 09/16/2014 7:22 AM Performed by: Elon AlasLEE, Dajai Wahlert BROWN Pre-anesthesia Checklist: Patient identified, Emergency Drugs available, Suction available, Patient being monitored and Timeout performed Patient Re-evaluated:Patient Re-evaluated prior to inductionOxygen Delivery Method: Circle system utilized Preoxygenation: Pre-oxygenation with 100% oxygen Intubation Type: IV induction Ventilation: Mask ventilation without difficulty Laryngoscope Size: Mac and 3 Grade View: Grade I Tube type: Oral Tube size: 7.0 mm Number of attempts: 1 Airway Equipment and Method: Stylet Placement Confirmation: ETT inserted through vocal cords under direct vision,  positive ETCO2 and breath sounds checked- equal and bilateral Secured at: 22 cm Tube secured with: Tape Dental Injury: Teeth and Oropharynx as per pre-operative assessment

## 2014-09-16 NOTE — Anesthesia Postprocedure Evaluation (Signed)
Anesthesia Post Note  Patient: Tanya Contreras  Procedure(s) Performed: Procedure(s) (LRB): LAPAROSCOPIC CHOLECYSTECTOMY WITH INTRAOPERATIVE CHOLANGIOGRAM (N/A)  Anesthesia type: general  Patient location: PACU  Post pain: Pain level controlled  Post assessment: Patient's Cardiovascular Status Stable  Last Vitals:  Filed Vitals:   09/16/14 1037  BP: 110/61  Pulse: 82  Temp:   Resp: 14    Post vital signs: Reviewed and stable  Level of consciousness: sedated  Complications: No apparent anesthesia complications

## 2014-09-16 NOTE — Interval H&P Note (Signed)
History and Physical Interval Note: No new questions or problems. 09/16/2014 7:04 AM  Tanya BoxMaria Contreras  has presented today for surgery, with the diagnosis of acute/chronic cholelithiasis  The various methods of treatment have been discussed with the patient and family. After consideration of risks, benefits and other options for treatment, the patient has consented to  Procedure(s): LAPAROSCOPIC CHOLECYSTECTOMY WITH INTRAOPERATIVE CHOLANGIOGRAM (N/A) as a surgical intervention .  The patient's history has been reviewed, patient examined, no change in status, stable for surgery.  I have reviewed the patient's chart and labs.  Questions were answered to the patient's satisfaction.     Azadeh Hyder, JAY

## 2014-09-16 NOTE — Anesthesia Preprocedure Evaluation (Addendum)
Anesthesia Evaluation  Patient identified by MRN, date of birth, ID band Patient awake    Reviewed: Allergy & Precautions, NPO status , Patient's Chart, lab work & pertinent test results  Airway Mallampati: I  TM Distance: >3 FB Neck ROM: Full    Dental  (+) Dental Advisory Given, Teeth Intact   Pulmonary          Cardiovascular     Neuro/Psych    GI/Hepatic   Endo/Other    Renal/GU      Musculoskeletal   Abdominal   Peds  Hematology   Anesthesia Other Findings   Reproductive/Obstetrics (+) Breast feeding                             Anesthesia Physical Anesthesia Plan  ASA: I  Anesthesia Plan: General   Post-op Pain Management:    Induction: Intravenous  Airway Management Planned: Oral ETT  Additional Equipment:   Intra-op Plan:   Post-operative Plan: Extubation in OR  Informed Consent: I have reviewed the patients History and Physical, chart, labs and discussed the procedure including the risks, benefits and alternatives for the proposed anesthesia with the patient or authorized representative who has indicated his/her understanding and acceptance.   Dental advisory given  Plan Discussed with: Surgeon and CRNA  Anesthesia Plan Comments: (Pt instructed to pump and dump brest milk for 24hrs and then resume feeding. Avoid Versed.)       Anesthesia Quick Evaluation

## 2014-09-16 NOTE — Discharge Instructions (Addendum)
[facility] ______[facility] LAPAROSCOPIC SURGERY: POST OP INSTRUCTIONS Always review your discharge instruction sheet given to you by the facility where your surgery was performed. IF YOU HAVE DISABILITY OR FAMILY LEAVE FORMS, YOU MUST BRING THEM TO THE OFFICE FOR PROCESSING.   DO NOT GIVE THEM TO YOUR DOCTOR.  1. A prescription for pain medication may be given to you upon discharge.  Take your pain medication as prescribed, if needed.  If narcotic pain medicine is not needed, then you may take acetaminophen (Tylenol) or ibuprofen (Advil) as needed. 2. Take your usually prescribed medications unless otherwise directed. 3. If you need a refill on your pain medication, please contact your pharmacy.  They will contact our office to request authorization. Prescriptions will not be filled after 5pm or on week-ends. 4. You should follow a light diet the first few days after arrival home, such as soup and crackers, etc.  Be sure to include lots of fluids daily. 5. Most patients will experience some swelling and bruising in the area of the incisions.  Ice packs will help.  Swelling and bruising can take several days to resolve.  6. It is common to experience some constipation if taking pain medication after surgery.  Increasing fluid intake and taking a stool softener (such as Colace) will usually help or prevent this problem from occurring.  A mild laxative (Milk of Magnesia or Miralax) should be taken according to package instructions if there are no bowel movements after 48 hours. 7. Unless discharge instructions indicate otherwise, you may remove your bandages 24-48 hours after surgery, and you may shower at that time.  You may have steri-strips (small skin tapes) in place directly over the incision.  These strips should be left on the skin for 7-10 days.  If your surgeon used skin glue on the incision, you may shower in 24 hours.  The glue will flake off over the next 2-3 weeks.  Any sutures or  staples will be removed at the office during your follow-up visit. 8. ACTIVITIES:  You may resume regular (light) daily activities beginning the next day--such as daily self-care, walking, climbing stairs--gradually increasing activities as tolerated.  You may have sexual intercourse when it is comfortable.  Refrain from any heavy lifting or straining until approved by your doctor. a. You may drive when you are no longer taking prescription pain medication, you can comfortably wear a seatbelt, and you can safely maneuver your car and apply brakes. b. RETURN TO WORK:  _2 weeks_________________________________________________________ 9. You should see your doctor in the office for a follow-up appointment approximately 2-3 weeks after your surgery.  Make sure that you call for this appointment within a day or two after you arrive home to insure a convenient appointment time. 10. OTHER INSTRUCTIONS:Leave dressing intact __________________________________________________________________________________________________________________________ __________________________________________________________________________________________________________________________ WHEN TO CALL YOUR DOCTOR: 1. Fever over 101.0 2. Inability to urinate 3. Continued bleeding from incision. 4. Increased pain, redness, or drainage from the incision. 5. Increasing abdominal pain  The clinic staff is available to answer your questions during regular business hours.  Please dont hesitate to call and ask to speak to one of the nurses for clinical concerns.  If you have a medical emergency, go to the nearest emergency room or call 911.  A surgeon from [facility] is always on call at the hospital. [address], [place], [place]  [address] ? [address] [address], [place], [place]   [address] [phone] ? [phone] ? FAX [phone]  [phone] Web site: [email or web address]   Colecistectoma  laparoscpica - Cuidados posteriores (Laparoscopic Cholecystectomy, Care After) Siga estas instrucciones durante las prximas semanas. Estas indicaciones le proporcionan informacin general acerca de cmo deber cuidarse despus del procedimiento. El mdico tambin podr darle instrucciones ms especficas. El tratamiento ha sido planificado segn las prcticas mdicas actuales, pero en algunos casos pueden ocurrir problemas. Comunquese con el mdico si tiene algn problema o tiene dudas despus del procedimiento. QU ESPERAR DESPUS DEL PROCEDIMIENTO Despus del procedimiento, es comn tener las siguientes sensaciones:  Dolor en los lugares de la incisin. Le darn analgsicos para Human resources officercontrolar el dolor.  Nuseas o vmitos leves. Estos sntomas deberan mejorar despus de las primeras 24horas.  Meteorismo y Designer, fashion/clothingposiblemente dolor en el hombro debido al gas que se [place] durante el procedimiento. Estos sntomas mejorarn despus de las primeras 24horas. INSTRUCCIONES PARA EL CUIDADO EN EL HOGAR   Cambie los apsitos (vendajes) tal como le indic el mdico.  Mantenga la herida limpia y seca. Puede lavar la herida suavemente con agua y [place]. Seque dando palmaditas suaves.  No se bae en la baera, no practique natacin ni use el jacuzzy durante 2semanas o hasta que lo autorice el mdico.  Crystalome solo medicamentos de venta libre o recetados, segn las indicaciones del mdico.  Siga su dieta normal segn las indicaciones de su mdico.  No levante ningn objeto que pese ms de 10libras (4,5kg) hasta que el mdico lo autorice.  No practique deportes de contacto durante 1semana o hasta que el mdico lo autorice. SOLICITE ATENCIN MDICA SI:   Presenta enrojecimiento, hinchazn o aumento del dolor en la herida.  Observa una secrecin de color blanco amarillento (pus) en la herida.  Hay una secrecin en la herida que dura ms de 1da.  Advierte un olor ftido que proviene de la herida o del  vendaje.  Los cortes quirrgicos (incisiones) se abren. SOLICITE ATENCIN MDICA DE INMEDIATO SI:   Le aparece una erupcin cutnea.  Tiene dificultad para respirar.  Siente dolor en el pecho.  Tiene fiebre.  Nota un incremento del dolor en los hombros (en la zona donde van los breteles).  Presenta episodios de mareos o se siente dbil cuando est de pie.  Siente un dolor abdominal intenso.  Tiene Programme researcher, broadcasting/film/videomalestar estomacal (nuseas) o vomita y esto dura ms de 1da. Document Released: 03/18/2011 Document Revised: 05/26/2013 [facility] Patient Information 2015 [employer], [place]. This information is not intended to replace advice given to you by your health care provider. Make sure you discuss any questions you have with your health care provider.

## 2014-09-19 ENCOUNTER — Encounter (HOSPITAL_COMMUNITY): Payer: Self-pay | Admitting: General Surgery

## 2014-10-28 ENCOUNTER — Ambulatory Visit: Payer: Self-pay | Attending: Internal Medicine

## 2015-02-03 ENCOUNTER — Encounter: Payer: Self-pay | Admitting: Internal Medicine

## 2015-02-03 ENCOUNTER — Ambulatory Visit: Payer: Self-pay | Attending: Internal Medicine | Admitting: Internal Medicine

## 2015-02-03 VITALS — BP 103/73 | HR 93 | Wt 134.4 lb

## 2015-02-03 DIAGNOSIS — K0889 Other specified disorders of teeth and supporting structures: Secondary | ICD-10-CM

## 2015-02-03 DIAGNOSIS — H538 Other visual disturbances: Secondary | ICD-10-CM

## 2015-02-03 DIAGNOSIS — K088 Other specified disorders of teeth and supporting structures: Secondary | ICD-10-CM

## 2015-02-03 MED ORDER — AMOXICILLIN 500 MG PO CAPS: 500.0000 mg | ORAL_CAPSULE | Freq: Three times a day (TID) | ORAL | Status: DC

## 2015-02-03 MED ORDER — TRAMADOL HCL 50 MG PO TABS: 50.0000 mg | ORAL_TABLET | Freq: Three times a day (TID) | ORAL | Status: DC | PRN

## 2015-02-03 MED ORDER — IBUPROFEN 600 MG PO TABS: 600.0000 mg | ORAL_TABLET | Freq: Three times a day (TID) | ORAL | Status: DC | PRN

## 2015-02-03 NOTE — Progress Notes (Signed)
  New patient here to established care. Tanya Contreras is requesting a dental referral.

## 2015-02-03 NOTE — Progress Notes (Signed)
Patient ID: Tanya Contreras, female   DOB: July 12, 1991, 24 y.o.   MRN: [id number]  [id number]:[id number]  [id number]:[id number]  DOB - 03/22/1991  CC:  Chief Complaint  Patient presents with  . New patient    Dental Referral       HPI: Tanya Contreras is a 24 y.o. female here today to establish medical care.  Patient has no past medical history. Today she c/o of dental pain for the past 6 months. The pain is located in bilateral molar area. She has noticed some inflammation and bleeding of the gums. She has not been to the dentist in several years due to lack of insurance. She has tried tylenol for pain and some old amoxicillin she had at home. She notes some blurred vision.   Patient has No headache, No chest pain, No abdominal pain - No Nausea, No new weakness tingling or numbness, No Cough - SOB.  No Known Allergies Past Medical History  Diagnosis Date  . No significant past medical history   . Medical history non-contributory    Current Outpatient Prescriptions on File Prior to Visit  Medication Sig Dispense Refill  . HYDROcodone-acetaminophen (NORCO) 5-325 MG per tablet Take 1 tablet by mouth every 4 (four) hours as needed for severe pain. (Patient not taking: Reported on 02/03/2015) 20 tablet 0  . HYDROcodone-acetaminophen (NORCO/VICODIN) 5-325 MG per tablet Take 1-2 tablets by mouth every 4 (four) hours as needed for moderate pain. (Patient not taking: Reported on 02/03/2015) 30 tablet 0  . ibuprofen (ADVIL,MOTRIN) 600 MG tablet Take 1 tablet (600 mg total) by mouth every 6 (six) hours. (Patient not taking: Reported on 09/14/2014) 30 tablet 0  . ondansetron (ZOFRAN ODT) 4 MG disintegrating tablet  ODT q4 hours prn nausea/vomit (Patient not taking: Reported on 02/03/2015) 8 tablet 0   No current facility-administered medications on file prior to visit.   Family History  Problem Relation Age of Onset  . Alcohol abuse Neg Hx    History   Social History  . Marital Status: Single    Spouse Name:  N/A  . Number of Children: N/A  . Years of Education: N/A   Occupational History  . Not on file.   Social History Main Topics  . Smoking status: Never Smoker   . Smokeless tobacco: Not on file  . Alcohol Use: No  . Drug Use: No  . Sexual Activity: Yes   Other Topics Concern  . Not on file   Social History Narrative    Review of Systems: See HPI   Objective:   Filed Vitals:   02/03/15 1458  BP: 103/73  Pulse: 93    Physical Exam  Constitutional: She is oriented to person, place, and time.  HENT:  Wisdom teeth on bilateral sides   Eyes: Conjunctivae and EOM are normal. Pupils are equal, round, and reactive to light.  Neurological: She is oriented to person, place, and time.  .  Lab Results  Component Value Date   WBC 7.1 09/15/2014   HGB 13.8 09/15/2014   HCT 41.1 09/15/2014   MCV 87.6 09/15/2014   PLT 361 09/15/2014   Lab Results  Component Value Date   CREATININE 0.54 09/15/2014   BUN 12 09/15/2014   NA 141 09/15/2014   K 3.8 09/15/2014   CL 106 09/15/2014   CO2 28 09/15/2014    No results found for: HGBA1C Lipid Panel  No results found for: CHOL, TRIG, HDL, CHOLHDL, VLDL, LDLCALC     Assessment and  plan:   Tanya Contreras was seen today for new patient.  Diagnoses and all orders for this visit:  Pain, dental Orders: -     Ambulatory referral to Dentistry -     amoxicillin (AMOXIL) 500 MG capsule; Take 1 capsule (500 mg total) by mouth 3 (three) times daily. -     traMADol (ULTRAM) 50 MG tablet; Take 1 tablet (50 mg total) by mouth every 8 (eight) hours as needed.---for severe pain -     ibuprofen (ADVIL,MOTRIN) 600 MG tablet; Take 1 tablet (600 mg total) by mouth every 8 (eight) hours as needed. Low cost dental list given to patient   Blurred vision, right eye Low cost optometry list given to patient   Return if symptoms worsen or fail to improve.    Due to language barrier, an interpreter was present during the history-taking and subsequent  discussion (and for part of the physical exam) with this patient.   [place], NP-C [facility] [phone] 02/03/2015, 3:11 PM

## 2015-04-28 ENCOUNTER — Ambulatory Visit: Payer: Self-pay | Attending: Internal Medicine

## 2015-07-13 IMAGING — RF DG CHOLANGIOGRAM OPERATIVE
1 series · 5 of 5 positions shown · non-contrast
Comparison: Abdominal ultrasound on 08/05/2014

CLINICAL DATA: Cholecystectomy for cholelithiasis and
cholecystitis.

EXAM:
INTRAOPERATIVE CHOLANGIOGRAM
TECHNIQUE: Cholangiographic images from the C-arm fluoroscopic device were
submitted for interpretation post-operatively. Please see the
procedural report for the amount of contrast and the fluoroscopy
time utilized.

[Series 1: run · 2 acquisitions, 5 frames shown]
[im 1/2]
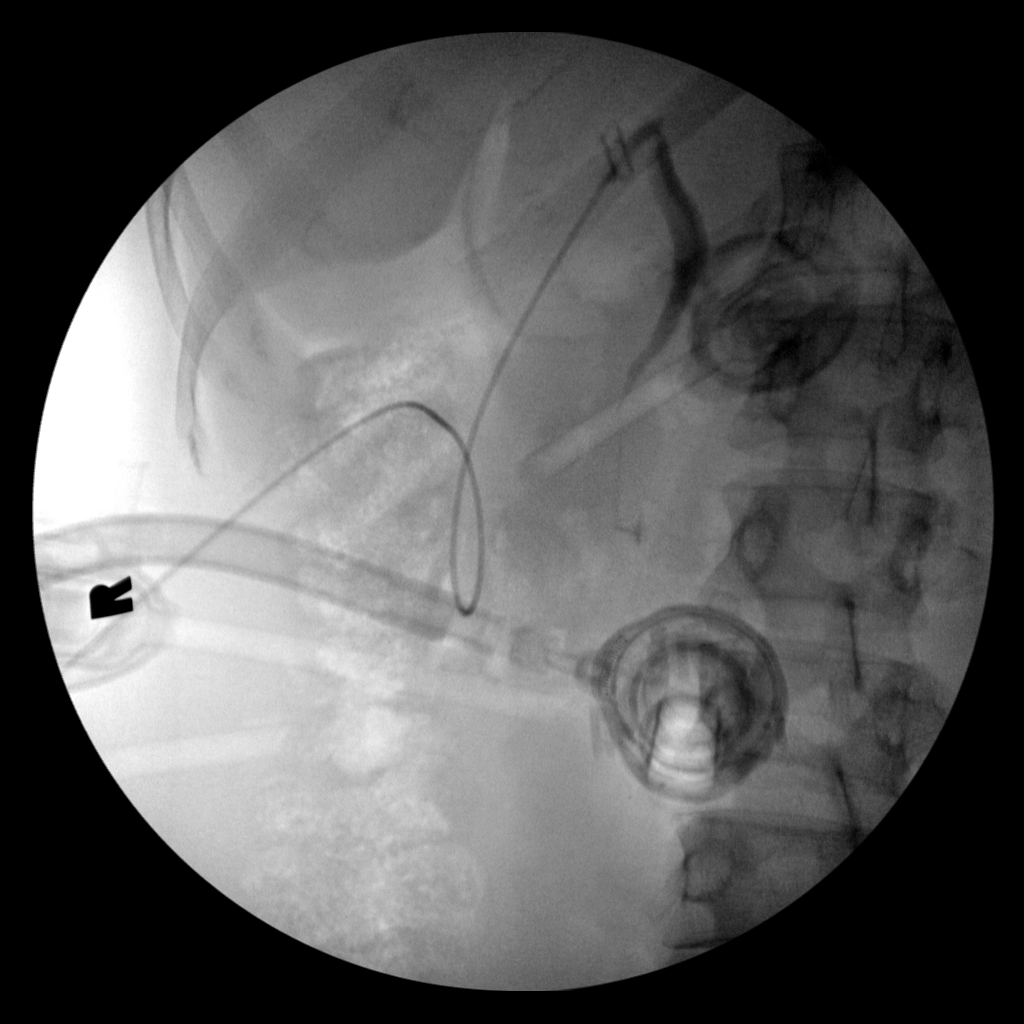
[im 1/2]
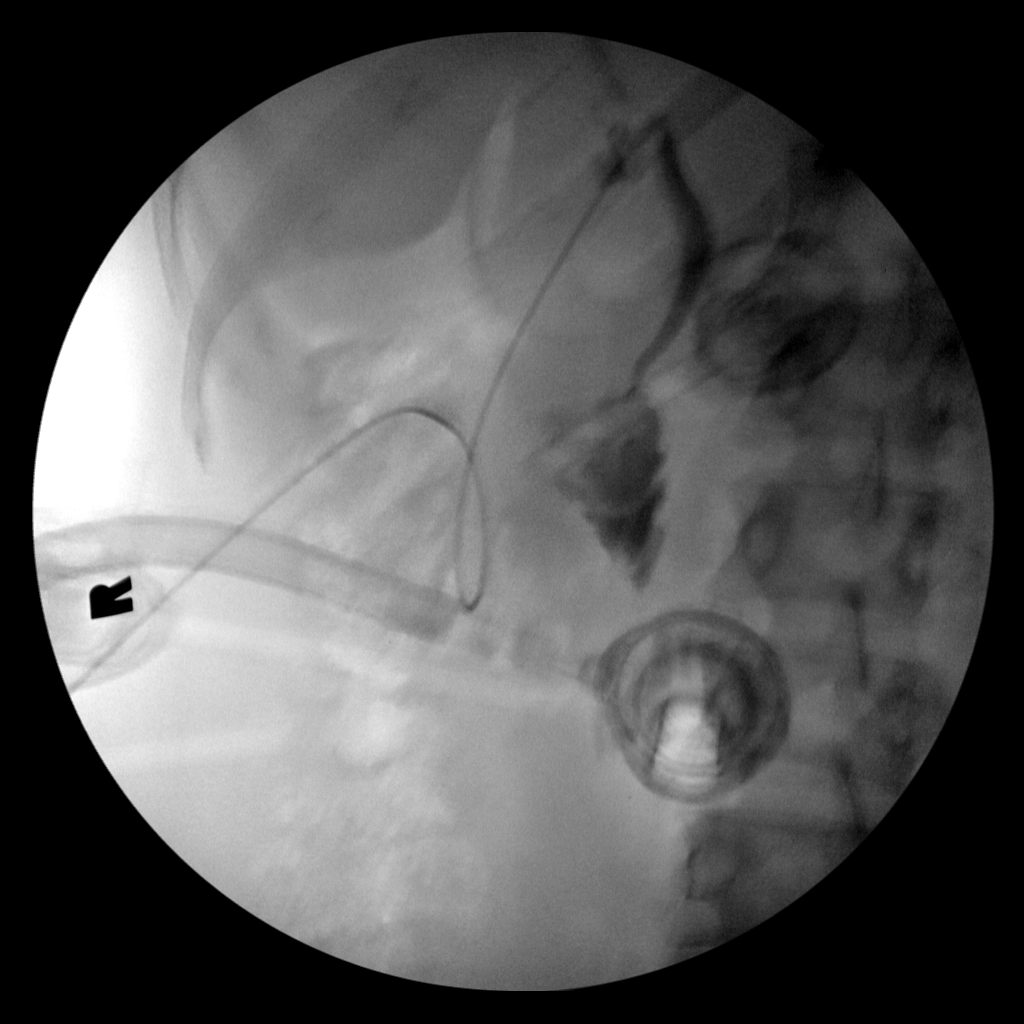
[im 1/2]
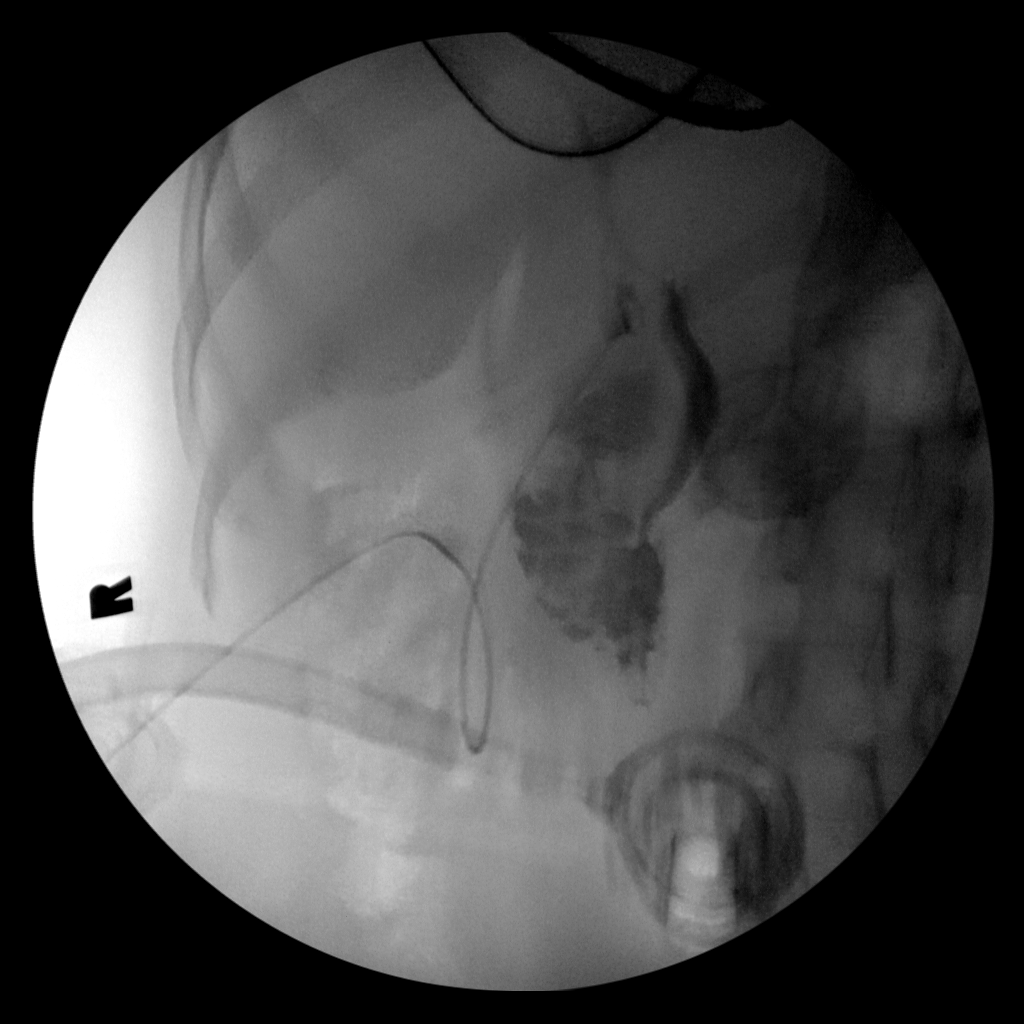
[im 1/2]
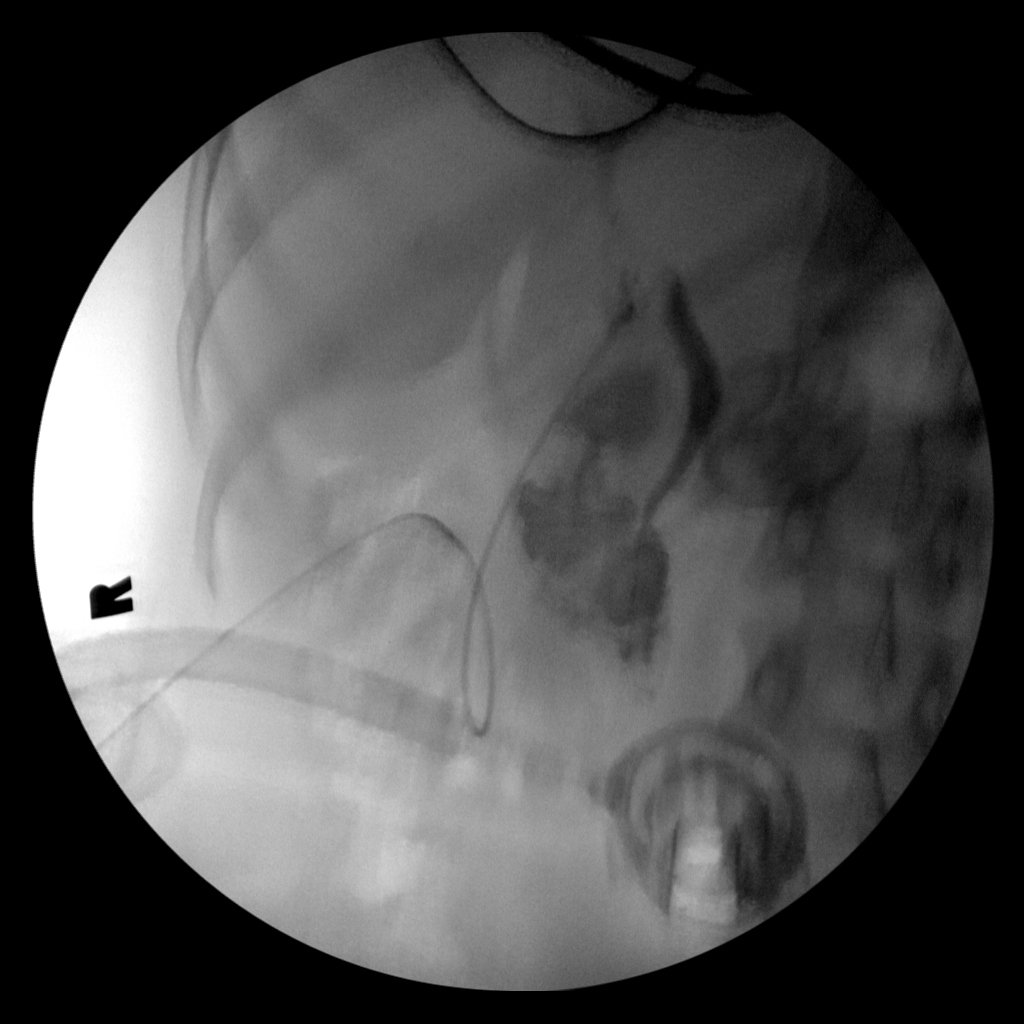
[im 2/2]
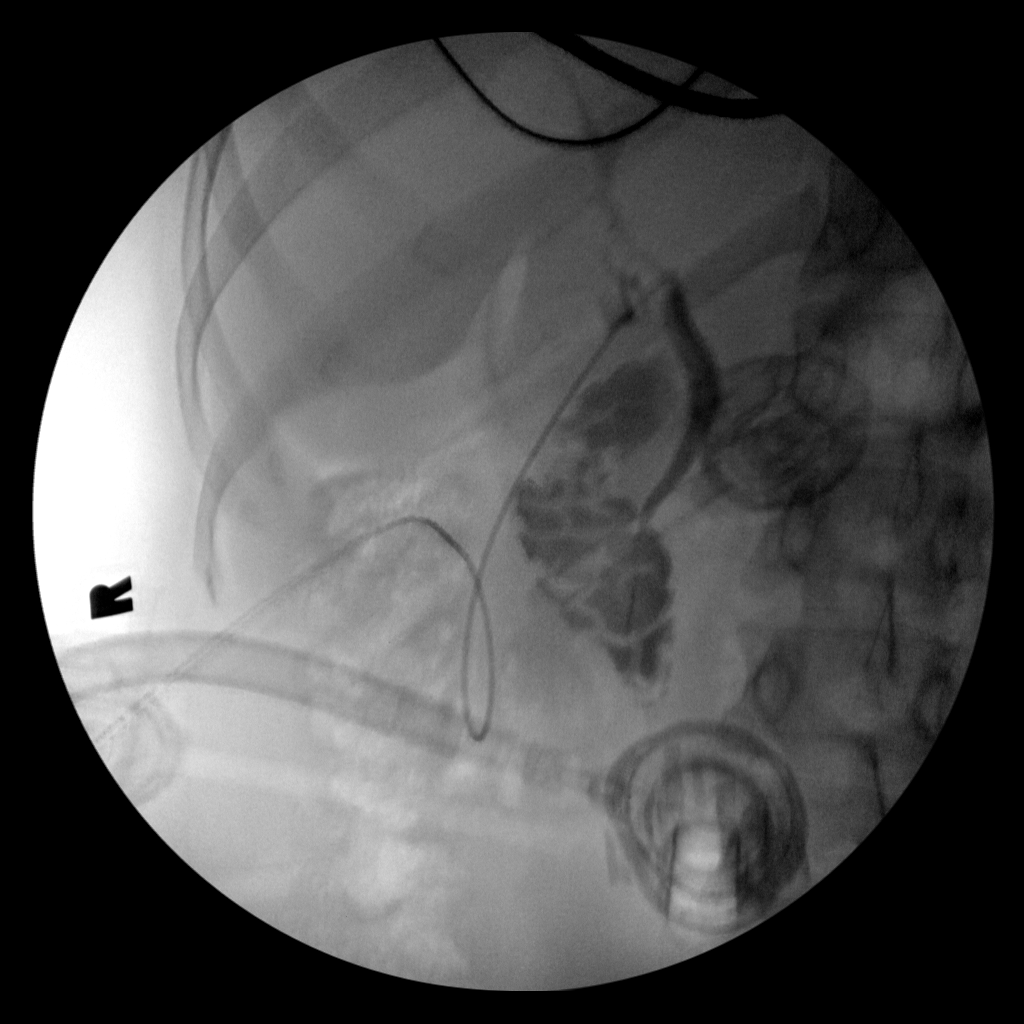

[5 of 5 positions shown; findings below may reference images not displayed]

FINDINGS: Cholangiogram images obtained intraoperatively with a C-arm
demonstrate a normal opacified biliary tree without evidence of
biliary obstruction. No filling defects are identified. Contrast
enters the duodenum normally. No contrast extravasation identified.
IMPRESSION: Normal intraoperative cholangiogram.

## 2015-10-31 ENCOUNTER — Ambulatory Visit: Payer: Self-pay | Attending: Internal Medicine

## 2016-04-29 ENCOUNTER — Ambulatory Visit: Payer: Self-pay | Attending: Internal Medicine

## 2016-05-13 ENCOUNTER — Ambulatory Visit: Payer: Self-pay | Attending: Internal Medicine | Admitting: Internal Medicine

## 2016-05-13 ENCOUNTER — Telehealth: Payer: Self-pay | Admitting: Internal Medicine

## 2016-05-13 ENCOUNTER — Encounter: Payer: Self-pay | Admitting: Internal Medicine

## 2016-05-13 VITALS — BP 100/68 | HR 73 | Temp 98.2°F | Resp 16 | Wt 164.6 lb

## 2016-05-13 DIAGNOSIS — H53141 Visual discomfort, right eye: Secondary | ICD-10-CM | POA: Insufficient documentation

## 2016-05-13 DIAGNOSIS — Z23 Encounter for immunization: Secondary | ICD-10-CM | POA: Insufficient documentation

## 2016-05-13 DIAGNOSIS — H5711 Ocular pain, right eye: Secondary | ICD-10-CM

## 2016-05-13 DIAGNOSIS — Z6830 Body mass index (BMI) 30.0-30.9, adult: Secondary | ICD-10-CM | POA: Insufficient documentation

## 2016-05-13 DIAGNOSIS — H538 Other visual disturbances: Secondary | ICD-10-CM | POA: Insufficient documentation

## 2016-05-13 DIAGNOSIS — K0889 Other specified disorders of teeth and supporting structures: Secondary | ICD-10-CM

## 2016-05-13 DIAGNOSIS — Z0000 Encounter for general adult medical examination without abnormal findings: Secondary | ICD-10-CM

## 2016-05-13 LAB — CBC WITH DIFFERENTIAL/PLATELET
BASOS PCT: 1 %
Basophils Absolute: 87 cells/uL (ref 0–200)
EOS PCT: 2 %
Eosinophils Absolute: 174 cells/uL (ref 15–500)
HCT: 40.1 % (ref 35.0–45.0)
Hemoglobin: 13.4 g/dL (ref 11.7–15.5)
Lymphocytes Relative: 34 %
Lymphs Abs: 2958 cells/uL (ref 850–3900)
MCH: 28.7 pg (ref 27.0–33.0)
MCHC: 33.4 g/dL (ref 32.0–36.0)
MCV: 85.9 fL (ref 80.0–100.0)
MONOS PCT: 8 %
MPV: 10.2 fL (ref 7.5–12.5)
Monocytes Absolute: 696 cells/uL (ref 200–950)
NEUTROS ABS: 4785 {cells}/uL (ref 1500–7800)
Neutrophils Relative %: 55 %
PLATELETS: 362 10*3/uL (ref 140–400)
RBC: 4.67 MIL/uL (ref 3.80–5.10)
RDW: 14.4 % (ref 11.0–15.0)
WBC: 8.7 10*3/uL (ref 3.8–10.8)

## 2016-05-13 LAB — BASIC METABOLIC PANEL WITH GFR
BUN: 12 mg/dL (ref 7–25)
CO2: 22 mmol/L (ref 20–31)
Calcium: 9.8 mg/dL (ref 8.6–10.2)
Chloride: 104 mmol/L (ref 98–110)
Creat: 0.7 mg/dL (ref 0.50–1.10)
GFR, Est African American: >89 mL/min (ref >=60)
GFR, Est Non African American: >89 mL/min (ref >=60)
GLUCOSE: 84 mg/dL (ref 65–99)
Potassium: 4.2 mmol/L (ref 3.5–5.3)
Sodium: 139 mmol/L (ref 135–146)

## 2016-05-13 LAB — POCT GLYCOSYLATED HEMOGLOBIN (HGB A1C): Hemoglobin A1C: 5.8

## 2016-05-13 NOTE — Progress Notes (Signed)
Pt is in the office today for establish care and a referral Pt states her pain level is a 2 Pt states her pain is coming from the underneath the right eye near her nose Pt states she has been having blurry vision for 3 days now

## 2016-05-13 NOTE — Patient Instructions (Addendum)
Vacuna Tdap (contra la difteria, el ttanos y [place]): Lo que debe saber (Tdap Vaccine [Tetanus, Diphtheria, and Pertussis]: What You Need to Know) 1. Por qu vacunarse? El ttanos, la difteria y la tosferina son enfermedades muy graves. La vacuna Tdap nos puede proteger de estas enfermedades. Adems, la vacuna Tdap que se aplica a las Chemical engineer a los bebs recin nacidos contra la tosferina. En la actualidad, el [place] (trismo) es una enfermedad poco frecuente en los [place]. Provoca la contraccin y el endurecimiento dolorosos de los msculos, por lo general, de todo el cuerpo.  Puede causar el endurecimiento de los msculos de la cabeza y el cuello, de modo que impide abrir la boca, tragar y en algunos casos, Ambulance person. El ttanos causa la muerte de aproximadamente 1de cada 10personas que contraen la infeccin, incluso despus de que reciben la mejor atencin mdica. La DIFTERIA tambin es poco frecuente en los [place] [employer]. Puede causar la formacin de una membrana gruesa en la parte posterior de la garganta.  Esto tiene como consecuencia problemas respiratorios, insuficiencia cardaca, parlisis y [place]. La TOSFERINA (tos convulsa) provoca episodios de tos intensa que pueden dificultar la respiracin y provocar vmitos y trastornos del sueo.  Tambin puede causar prdida de peso, incontinencia y fractura de [place]. Dos de cada 100 adolescentes y 5 de cada 100 adultos con tosferina deben ser hospitalizados o tienen complicaciones, que podran incluir neumona y [place]. Estas enfermedades son provocadas por bacterias. La difteria y la tosferina se contagian de Ardelia Mems persona a otra a travs de las secreciones de la tos o el estornudo. El ttanos ingresa al organismo a travs de cortes, rasguos o heridas. Antes de las vacunas, en los [place] se informaban 200000 casos de difteria, 200000 casos de tosferina y cientos de casos de ttanos cada  ao. Desde el inicio de la vacunacin, los informes de casos de ttanos y difteria han disminuido alrededor del 99%, y de tosferina, alrededor del 80%. 2. Edward Jolly Tdap La vacuna Tdap protege a adolescentes y adultos contra el ttanos, la difteria y la tosferina. Una dosis de Tdap se administra a los 80 o 12 aos. Las [place] [employer] no recibieron la vacuna Tdap a esa edad deben recibirla tan pronto como sea posible. Es muy importante que los mdicos y todos aquellos que tengan contacto cercano con bebs menores de 62meses reciban la vacuna Tdap. Las mujeres deben recibir una dosis de Tdap en cada Media planner, para proteger al recin nacido de la tosferina. Los nios tienen mayor riesgo de complicaciones graves y potencialmente mortales debido a la tosferina. Otra vacuna llamada Td protege contra el ttanos y la difteria, pero no contra la tosferina. Todos deben recibir una dosis de refuerzo de Td cada 10 aos. La Tdap puede aplicarse como uno de estos refuerzos si nunca antes recibi esta vacuna. Tambin se puede aplicar despus de un corte o quemadura grave para prevenir la infeccin por ttanos. El mdico o la persona que le aplique la vacuna puede darle ms informacin al [employer]. La Tdap puede administrarse de manera segura simultneamente con otras vacunas. 3. Algunas personas no deben recibir la [employer] persona que alguna vez tuvo una reaccin alrgica potencialmente mortal a Ardelia Mems dosis previa de cualquier vacuna contra el ttanos, la difteria o la tosferina, O que tenga una alergia grave a cualquiera de los componentes de esta vacuna, no debe recibir la vacuna Tdap. Informe a la Web designer la vacuna si tiene  cualquier alergia grave.  Una persona que estuvo en estado de coma o sufri mltiples convulsiones en el trmino de los 7das despus de recibir una dosis de DTP o DTaP, o una dosis previa de Tdap, no debe recibir la vacuna Tdap, salvo que se haya encontrado otra causa que no fuera  la vacuna. An puede recibir la Td.  Consulte con su mdico si:  tiene convulsiones u otro problema del sistema nervioso,  tuvo hinchazn o dolor intenso despus de cualquier vacuna contra la difteria o el ttanos,  alguna vez ha sufrido el sndrome de [place],  no se siente Pharmacologist en que se ha programado la vacuna. 4. Riesgos Con cualquier medicamento, incluyendo las vacunas, existe la posibilidad de que aparezcan efectos secundarios. Suelen ser leves y desaparecen por s solos. Tambin son posibles las reacciones graves, pero en raras ocasiones. La [employer] de las personas a las que se les aplica la vacuna Tdap no tienen ningn problema. Problemas leves despus de la vacuna Tdap (No interfirieron en otras actividades)  Dolor en el lugar donde se aplic la vacuna (alrededor de 3 de cada 4 adolescentes o 2 de cada 3 adultos).  Enrojecimiento o hinchazn en el lugar donde se aplic la vacuna (1 de cada 5 personas).  Fiebre leve de al menos 100,21F (38C) (hasta alrededor de 1 cada 25 adolescentes o 1 de cada 100 adultos).  Dolor de cabeza (alrededor de 3 o 4 de cada 10 personas).  Cansancio (alrededor de 1 de cada 3 o 4 personas).  Nuseas, vmitos, diarrea, dolor de estmago (hasta 1 de cada 4 adolescentes o 1 de cada 10 adultos).  Escalofros, dolores articulares (alrededor de 1de cada 10personas).  Dolores corporales (alrededor de 1de cada 3 o 4personas).  Erupcin cutnea, inflamacin de los ganglios (poco frecuente). Problemas moderados despus de recibir la vacuna Tdap (Interfirieron en otras actividades, pero no requirieron atencin mdica)  Management consultant donde se aplic la vacuna (hasta 1de cada 5 o 6).  Enrojecimiento o inflamacin en el lugar donde se aplic la vacuna (hasta alrededor de 1 de cada 16adolescentes o 1 de cada 12adultos).  Fiebre de ms de 102F (38,8C) (alrededor de 1 de cada 100 adolescentes o 1 de cada 250  adultos).  Dolor de cabeza (alrededor de 1de cada 7adolescentes o 1de cada 10adultos).  Nuseas, vmitos, diarrea, dolor de estmago (hasta 1 o 3 de cada 100 personas).  Hinchazn de todo el brazo en el que se aplic la vacuna (hasta alrededor de 1de cada 500personas). Problemas graves despus de la vacuna Tdap (Impidieron Optometrist las actividades habituales; requirieron atencin mdica)  Inflamacin, dolor intenso, sangrado y enrojecimiento en el brazo en que se aplic la vacuna (poco frecuente). Problemas que podran ocurrir despus de cualquier vacuna:  Las personas a veces se desmayan despus de un procedimiento mdico, incluida la vacunacin. Si permanece sentado o recostado durante 15 minutos puede ayudar a [employer] y las lesiones causadas por las cadas. Informe al mdico si se siente mareado, tiene cambios en la visin o zumbidos en los odos.  Algunas personas sienten un dolor intenso en el hombro y tienen dificultad para mover el brazo donde se coloc la vacuna. Esto sucede con muy poca frecuencia.  Cualquier medicamento puede causar una reaccin alrgica grave. Dichas reacciones son Orlene Erm poco frecuentes con una vacuna (se calcula que menos de 1en un milln de dosis) y se producen de unos minutos a unas horas despus de Writer. Al  igual que con cualquier [employer], existe una probabilidad muy remota de que una vacuna cause una lesin grave o la [place]. Se controla permanentemente la seguridad de las vacunas. Para obtener ms informacin, visite: http://www.aguilar.[email or web address] 5. Qu pasa si hay un problema grave? A qu signos debo estar atento?  Observe todo lo que le preocupe, como signos de una reaccin alrgica grave, fiebre muy alta o comportamiento fuera de lo normal.  Los signos de una reaccin alrgica grave pueden incluir ronchas, hinchazn de la cara y la garganta, dificultad para respirar, latidos cardacos acelerados, mareos y debilidad.  Generalmente, estos comenzaran entre unos pocos minutos y algunas horas despus de la vacunacin. Qu debo hacer?  Si usted piensa que se trata de una reaccin alrgica grave o de otra emergencia que no puede esperar, llame al 911 o lleve a la persona al hospital ms cercano. Sino, llame a su mdico.  Despus, la reaccin debe informarse al Sistema de Informacin sobre Efectos Adversos de las [place] (Vaccine Adverse Event Reporting System, VAERS). Su mdico puede presentar este informe, o puede hacerlo usted mismo a travs del sitio web de VAERS, en [email or web address], o llamando al [phone]. VAERS no brinda recomendaciones mdicas. 6. [place] Compensacin de Daos por [place] de Compensacin de Daos por Clinical biochemist (National Vaccine Injury Compensation Program, VICP) es un programa federal que fue creado para Patent examiner a las personas que puedan haber sufrido daos al recibir ciertas vacunas. Aquellas personas que consideren que han sufrido un dao como consecuencia de una vacuna y [place] saber ms acerca del programa y de cmo presentar Raechel [place], pueden llamar al [phone] o visitar su sitio web en [email or web address]. Hay un lmite de tiempo para presentar un reclamo de compensacin. 7. Cmo puedo obtener ms informacin?  Consulte a su mdico. Este puede darle el prospecto de la vacuna o recomendarle otras fuentes de informacin.  Comunquese con el servicio de salud de su localidad o su estado.  Comunquese con los Centros para Building surveyor y la Prevencin de Probation officer for Disease Control and Prevention , CDC).  Llame al [phone] (1-800-CDC-INFO), o  visite el sitio web [employer] en http://[email or web address]/. Declaracin de informacin sobre la vacuna contra la difteria, el ttanos y la tosferina (Tdap) de los CDC (24/02/15)   Esta informacin no tiene Marine scientist el consejo del mdico. Asegrese de hacerle  al mdico cualquier pregunta que tenga.   Document Released: 07/22/2012 Document Revised: 08/26/2014 Elsevier Interactive Patient Education 2016 [employer]. Influenza Virus Vaccine injection (Fluarix) Qu es este medicamento? La VACUNA ANTIGRIPAL ayuda a disminuir el riesgo de contraer la influenza, tambin conocida como la gripe. La vacuna solo ayuda a protegerle contra algunas cepas de influenza. Esta vacuna no ayuda a reducir Catering manager de contraer influenza pandmica H1N1. Este medicamento puede ser utilizado para otros usos; si tiene alguna pregunta consulte con su proveedor de atencin mdica o con su farmacutico. Qu le debo informar a mi profesional de la salud antes de tomar este medicamento? Necesita saber si usted presenta alguno de los siguientes problemas o situaciones: -trastorno de sangrado como hemofilia -fiebre o infeccin -sndrome de Guillain-Barre u otros problemas neurolgicos -problemas del sistema inmunolgico -infeccin por el virus de la inmunodeficiencia humana (VIH) o SIDA -niveles bajos de plaquetas en la sangre -esclerosis mltiple -una Risk analyst o inusual a las vacunas antigripales, a los huevos, protenas de pollo, al ltex, a la gentamicina, a otros medicamentos, alimentos, colorantes o conservantes -si est embarazada  o buscando quedar embarazada -si est amamantando a un beb Cmo debo utilizar este medicamento? Esta vacuna se administra mediante inyeccin por va intramuscular. Lo administra un profesional de Beazer Homesla salud. Recibir una copia de informacin escrita sobre la vacuna antes de cada vacuna. Asegrese de leer este folleto cada vez cuidadosamente. Este folleto puede cambiar con frecuencia. Hable con su pediatra para informarse acerca del uso de este medicamento en nios. Puede requerir atencin especial. Sobredosis: Pngase en contacto inmediatamente con un centro toxicolgico o una sala de urgencia si usted cree que haya tomado demasiado  medicamento. ATENCIN: Reynolds AmericanEste medicamento es solo para usted. No comparta este medicamento con nadie. Qu sucede si me olvido de una dosis? No se aplica en este caso. Qu puede interactuar con este medicamento? -quimioterapia o radioterapia -medicamentos que suprimen el sistema inmunolgico, tales como etanercept, anakinra, infliximab y adalimumab -medicamentos que tratan o previenen cogulos sanguneos, como warfarina -fenitona -medicamentos esteroideos, como la prednisona o la cortisona -teofilina -vacunas Puede ser que esta lista no menciona todas las posibles interacciones. Informe a su profesional de Beazer Homesla salud de [employer] Inctodos los productos a base de hierbas, medicamentos de [place] Gladeventa libre o suplementos nutritivos que est tomando. Si usted fuma, consume bebidas alcohlicas o si utiliza drogas ilegales, indqueselo tambin a su profesional de Beazer Homesla salud. Algunas sustancias pueden interactuar con su medicamento. A qu debo estar atento al usar PPL Corporationeste medicamento? Informe a su mdico o a Producer, television/film/videosu profesional de la Dollar Generalsalud sobre todos los efectos secundarios que persistan despus de [address] das. Llame a su proveedor de atencin mdica si se presentan sntomas inusuales dentro de las 6 semanas posteriores a la vacunacin. Es posible que todava pueda contraer la gripe, pero la enfermedad no ser tan fuerte como normalmente. No puede contraer la gripe de esta vacuna. La vacuna antigripal no le protege contra resfros u otras enfermedades que pueden causar [place]. Debe vacunarse cada ao. Qu efectos secundarios puedo tener al Boston Scientificutilizar este medicamento? Efectos secundarios que debe informar a su mdico o a Producer, television/film/videosu profesional de la salud tan pronto como sea posible: -reacciones alrgicas como erupcin cutnea, picazn o urticarias, hinchazn de la cara, labios o lengua Efectos secundarios que, por lo general, no requieren atencin mdica (debe informarlos a su mdico o a su profesional de la salud si persisten o si son  molestos): -fiebre -dolor de cabeza -molestias y dolores musculares -dolor, sensibilidad, enrojecimiento o Paramedichinchazn en el lugar de la inyeccin -cansancio o debilidad Puede ser que esta lista no menciona todos los posibles efectos secundarios. Comunquese a su mdico por asesoramiento mdico Hewlett-Packardsobre los efectos secundarios. Usted puede informar los efectos secundarios a la FDA por telfono al 1-800-FDA-1088. Dnde debo guardar mi medicina? Esta vacuna se administra solamente en clnicas, farmacias, consultorio mdico u otro consultorio de un profesional de la salud y no Teacher, early years/prenecesitar guardarlo en su domicilio. ATENCIN: Este folleto es un resumen. Puede ser que no cubra toda la posible informacin. Si usted tiene preguntas acerca de esta medicina, consulte con su mdico, su farmacutico o su profesional de Radiographer, therapeuticla salud.    2016, Elsevier/Gold Standard. (2010-02-06 15:31:40)  - QUICK START PATIENT GUIDE TO LCHF/IF LOW CARB HIGH FAT / INTERMITTENT FASTING What is this diet and how does it work? o Insulin is a hormone made by your body that allows you to use sugar (glucose) from carbohydrates in the food you eat for energy or to store glucose (as fat) for future use  o Insulin levels need to be lowered in order to  utilize our stored energy (fat) o Many struggling with obesity are insulin resistant and have high levels of insulin o This diet works to lower your insulin in two ways o Fasting - allows your insulin levels to naturally decrease  o Avoiding carbohydrates - carbs trigger increase in insulin Low Carb Healthy Fat (LCHF) o Get a free app for your phone, such as MyFitnessPal, to help you track your macronutrients (carbs/protein/fats) and to track your weight and body measurements to see your progress o Set your goal for around 10% carbs/20% protein/70% fat o A good starting goal for amount of net carbs per day is 50 grams (some will aim for 20 grams) o "Net carbs" refers to total grams of  carbs minus grams of fiber (as fiber is not typically absorbed). For example, if a food has 5g total carb and 3g fiber, that would be 2g net carbs o Increase healthy fats - eg. olive oil, eggs, nuts, avocado, cheese, butter, coconut, meats, fish o Avoid high carb foods - eg. bread, pasta, potatoes, rice, cookies, soda, juice, anything sugary o Buy full-fat ingredients (avoid low-fat versions, which often have more sugar) o No need to count calories, but pay close attention to grams of carbs on labels Intermittent Fasting (IF) o "Fasting" is going a period of time without eating - it helps to stay busy and well-hydrated o Purpose of fasting is to allow insulin levels to drop as low as possible, allowing your body to switch into fat-burning mode o With this diet there are many approaches to fasting, but 16:8 and 24hr fasts are commonly used o 16:8 fast, usually 5-7 days a week - Fasting for 16 hours of the day, then eating all meals for the day over course of 8 hours. o 24 hour fast, usually 1-3 days a week - Typically eating one meal a day, then fasting until the next day. Plenty of fluids (and some salt to help you hold onto fluids) are recommended during longer fasts.  o During fasts certain beverages are still acceptable - water, sparkling water, bone broth, black tea or coffee, or tea/coffee with small amount of heavy whipping cream Special note for those on diabetic medications o Discuss your medications with your physician. You may need to hold your medication or adjust to only taking when eating. Diabetics should keep close track of their blood sugars when making any changes to diet/meds, to ensure they are staying within normal limits For more info about LCHF/IF o Watch "Therapeutic Fasting - Solving the Two-Compartment Problem" video by Dr. Wylene Simmer on YouTube ([email or web address]) for a great intro to these concepts o Read "The Obesity Code" and/or "The Complete  Guide to Fasting" by Dr. Wylene Simmer o Go to www.dietdoctor.com for explanations, recipes, and infographics about foods to eat/avoid EXAMPLES TO GET STARTED Fasting Beverages -water (can add  tsp Pink Himalayan salt once or twice a day to help stay hydrated for longer fasts) -Sparkling water (such as [employer] or similar; avoid any with artificial sweeteners)  -Bone broth (multiple recipes available online or can buy pre-made) -Tea or Coffee (Adding heavy whipping cream or coconut oil to your tea or coffee can be helpful if you find yourself getting too hungry during the fasts. Can also add cinnamon for flavor. Or "bulletproof coffee.") Low Carb Healthy Fat Breakfast (if not fasting) -eggs in butter or olive oil with avocado -omelet with veggies and cheese  Lunch -hamburger with cheese and avocado wrapped in lettuce (no bun,  no ketchup) -meat and cheese wrapped in lettuce (can dip in mustard or olive oil/vinegar/mayo) -salad with meat/cheese/nuts and higher fat dressing (vinaigrette or Ranch, etc) -tuna salad lettuce wrap -taco meat with cheese, sour cream, guacamole, cheese over lettuce  Dinner -steak with herb butter or Barnaise sauce -"Fathead" pizza (uses cheese and almond flour for the dough - several recipes available online) -roasted or grilled chicken with skin on, with low carb sauce (buffalo, garlic butter, alfredo, pesto, etc) -baked salmon with lemon butter -chicken alfredo with zucchini noodles -[place] butter chicken with low carb garlic naan -egg roll in a bowl  Side Dishes -mashed cauliflower (homemade or available in freezer section) -roast vegetables (green veggies that grow above ground rather than root veggies) with butter or cheese -Caprese salad (fresh mozzarella, tomato and basil with olive oil) -homemade low-carb coleslaw Snacks/Desserts (try to avoid unnecessary snacking and sweets in general) -celery or cucumber dipped in guacamole or sour cream  dip -cheese and meat slices  -raspberries with whipped cream (can make homemade with no sugar added) -low carb [place] butter cake  AVOID - sugar, diet/regular soda, potatoes, breads, rice, pasta, candy, cookies, cakes, muffins, juice, high carb fruit (bananas, grapes), beer, ketchup, barbeque and other sweet sauces

## 2016-05-13 NOTE — Progress Notes (Signed)
Tanya Contreras, is a 25 y.o. female  WUJ:[id number] Contreras:[id number]  NFA:[id number]  DOB - Jun 17, 1991  CC:  Chief Complaint  Patient presents with  . Establish Care  . Referral       HPI: Tanya BoxMaria Contreras is a 25 y.o. female here today to establish medical care.  Doing well overall, no c/o except weight gained. She has gained about 30lbs in last year. +sedentary lifestyle as well.  She thinks she had papsmear last year, but not certain, done at 1100 (health dept?)  Does not smoke or drink.  C/o of right eye pain w/ associated photophobia and blurry vision last few days, no f/c/denies ha/nv.  Has never seen eye doctor, c/o of floaters as well.  Patient has No headache, No chest pain, No abdominal pain - No Nausea, No new weakness tingling or numbness, No Cough - SOB.    Review of Systems: Per HPI, o/w all systems reviewed and negative.  No Known Allergies Past Medical History:  Diagnosis Date  . Medical history non-contributory   . No significant past medical history    Current Outpatient Prescriptions on File Prior to Visit  Medication Sig Dispense Refill  . amoxicillin (AMOXIL) 500 MG capsule Take 1 capsule (500 mg total) by mouth 3 (three) times daily. (Patient not taking: Reported on 05/13/2016) 21 capsule 0  . HYDROcodone-acetaminophen (NORCO) 5-325 MG per tablet Take 1 tablet by mouth every 4 (four) hours as needed for severe pain. (Patient not taking: Reported on 05/13/2016) 20 tablet 0  . HYDROcodone-acetaminophen (NORCO/VICODIN) 5-325 MG per tablet Take 1-2 tablets by mouth every 4 (four) hours as needed for moderate pain. (Patient not taking: Reported on 05/13/2016) 30 tablet 0  . ibuprofen (ADVIL,MOTRIN) 600 MG tablet Take 1 tablet (600 mg total) by mouth every 8 (eight) hours as needed. (Patient not taking: Reported on 05/13/2016) 30 tablet 0  . ondansetron (ZOFRAN ODT) 4 MG disintegrating tablet 4mg  ODT q4 hours prn nausea/vomit (Patient not taking: Reported on 05/13/2016) 8 tablet 0  .  traMADol (ULTRAM) 50 MG tablet Take 1 tablet (50 mg total) by mouth every 8 (eight) hours as needed. (Patient not taking: Reported on 05/13/2016) 30 tablet 0   No current facility-administered medications on file prior to visit.    Family History  Problem Relation Age of Onset  . Alcohol abuse Neg Hx    Social History   Social History  . Marital status: Single    Spouse name: N/A  . Number of children: N/A  . Years of education: N/A   Occupational History  . Not on file.   Social History Main Topics  . Smoking status: Never Smoker  . Smokeless tobacco: Not on file  . Alcohol use No  . Drug use: No  . Sexual activity: Yes   Other Topics Concern  . Not on file   Social History Narrative  . No narrative on file    Objective:   Vitals:   05/13/16 1159  BP: 100/68  Pulse: 73  Resp: 16  Temp: 98.2 F (36.8 C)    Filed Weights   05/13/16 1159  Weight: 164 lb 9.6 oz (74.7 kg)    BP Readings from Last 3 Encounters:  05/13/16 100/68  02/03/15 103/73  09/16/14 110/61    Physical Exam: Constitutional: Patient appears well-developed and well-nourished. No distress. AAOx3, morbid obese. HENT: Normocephalic, atraumatic, External right and left ear normal. Oropharynx is clear and moist.  bilat tms clear. Eyes: Conjunctivae and EOM are  normal. PERRL, no scleral icterus. Neck: Normal ROM. Neck supple. No JVD. No tracheal deviation. No thyromegaly. CVS: RRR, S1/S2 +, no murmurs, no gallops, no carotid bruit.  Pulmonary: Effort and breath sounds normal, no stridor, rhonchi, wheezes, rales.  Abdominal: Soft. BS +, obese., no distension, tenderness, rebound or guarding.  Musculoskeletal: Normal range of motion. No edema and no tenderness.  LE: bilat/ no c/c/e, pulses 2+ bilateral. Neuro: Alert.  muscle tone coordination wnl. No cranial nerve deficit grossly. Skin: Skin is warm and dry. No rash noted. Not diaphoretic. No erythema. No pallor. Psychiatric: Normal mood and  affect. Behavior, judgment, thought content normal.  Lab Results  Component Value Date   WBC 7.1 09/15/2014   HGB 13.8 09/15/2014   HCT 41.1 09/15/2014   MCV 87.6 09/15/2014   PLT 361 09/15/2014   Lab Results  Component Value Date   CREATININE 0.54 09/15/2014   BUN 12 09/15/2014   NA 141 09/15/2014   K 3.8 09/15/2014   CL 106 09/15/2014   CO2 28 09/15/2014    No results found for: HGBA1C Lipid Panel  No results found for: CHOL, TRIG, HDL, CHOLHDL, VLDL, LDLCALC     Depression screen [facility] 2/9 05/13/2016 02/03/2015  Decreased Interest 0 0  Down, Depressed, Hopeless 0 0  PHQ - 2 Score 0 0   bilat vision screening 20/50 (w/o correction)  Assessment and plan:   1. Blurry vision, right, w/ photophobia and pain. - vision 20/50 bilat, ?floaters? - amb ref optho. May need glasses - denies ha/migraines  2. Morbid obesity dw pt at length re low carb diet, <50grams recd/daily. - gave her flyer for LCHF and IF diet, she has someone to help her translate. - recd walking daily 62mins/day, steadily work up to meet goals.  3. Health mantenance - tdap today - flu today. - thinks she needs papsmear, make appt w/ me soon. - chk cbc/bmp/tsh/a1c for screening  Return in about 3 weeks (around 06/03/2016) for PAP SMEAR.  The patient was given clear instructions to go to ER or return to medical center if symptoms don't improve, worsen or new problems develop. The patient verbalized understanding. The patient was told to call to get lab results if they haven't heard anything in the next week.    This note has been created with Education officer, environmental. Any transcriptional errors are unintentional.   Pete Glatter, MD, MBA/MHA [facility], [place] [phone]   05/13/2016, 12:28 PM

## 2016-05-13 NOTE — Telephone Encounter (Signed)
Will forward to pcp

## 2016-05-13 NOTE — Telephone Encounter (Signed)
Patient states she requested prescription for motrin or advil...please follow up

## 2016-05-14 LAB — TSH: TSH: 1.09 m[IU]/L

## 2016-05-14 MED ORDER — IBUPROFEN 600 MG PO TABS: 600.0000 mg | ORAL_TABLET | Freq: Three times a day (TID) | ORAL | 0 refills | Status: DC | PRN

## 2016-05-14 MED FILL — IBUPROFEN 600 MG TABLET: 600 | 10 days supply | Qty: 30 | Fill #0

## 2016-05-14 NOTE — Telephone Encounter (Signed)
Contacted pt to make aware that Dr. Julien NordmannLangeland refilled her motrin lvm regarding information and if she has any questions or concerns to give me a call back at the office

## 2016-05-14 NOTE — Telephone Encounter (Signed)
I refilled her motrin. thx

## 2016-05-22 ENCOUNTER — Telehealth: Payer: Self-pay

## 2016-05-22 NOTE — Telephone Encounter (Signed)
[employer] Shari Prowsvan [id number] contacted pt to go over lab results pt is aware of results and doesn't have any questions or concerns

## 2016-05-23 ENCOUNTER — Other Ambulatory Visit: Payer: Self-pay

## 2016-05-23 MED ORDER — METFORMIN HCL 500 MG PO TABS: 500.0000 mg | ORAL_TABLET | Freq: Once | ORAL | 3 refills | Status: DC

## 2016-05-23 MED FILL — metFORMIN HCL 500 MG TABS: 500 | 30 days supply | Qty: 30 | Fill #0

## 2016-07-02 MED FILL — ?METFORMIN HCL 500MG TABLET: 500 | 30 days supply | Qty: 30 | Fill #1

## 2016-07-30 MED FILL — ?METFORMIN HCL 500MG TABLET: 500 | 30 days supply | Qty: 30 | Fill #2

## 2016-09-13 MED FILL — metFORMIN HCL 500 MG TABS: 500 | 30 days supply | Qty: 30 | Fill #3

## 2016-11-13 ENCOUNTER — Ambulatory Visit: Payer: Self-pay | Attending: Internal Medicine

## 2017-01-02 ENCOUNTER — Encounter: Payer: Self-pay | Admitting: Internal Medicine

## 2017-01-03 ENCOUNTER — Encounter: Payer: Self-pay | Admitting: Internal Medicine

## 2017-01-06 ENCOUNTER — Encounter: Payer: Self-pay | Admitting: Internal Medicine

## 2017-03-03 ENCOUNTER — Ambulatory Visit: Payer: Self-pay | Admitting: Family Medicine

## 2017-03-14 ENCOUNTER — Ambulatory Visit: Payer: Self-pay | Admitting: Family Medicine

## 2017-04-16 ENCOUNTER — Ambulatory Visit: Payer: Self-pay | Attending: Family Medicine | Admitting: Family Medicine

## 2017-04-16 VITALS — BP 95/66 | HR 79 | Temp 98.1°F | Resp 18 | Ht 61.0 in | Wt 140.4 lb

## 2017-04-16 DIAGNOSIS — Z131 Encounter for screening for diabetes mellitus: Secondary | ICD-10-CM | POA: Insufficient documentation

## 2017-04-16 DIAGNOSIS — Z7984 Long term (current) use of oral hypoglycemic drugs: Secondary | ICD-10-CM | POA: Insufficient documentation

## 2017-04-16 DIAGNOSIS — H43393 Other vitreous opacities, bilateral: Secondary | ICD-10-CM

## 2017-04-16 DIAGNOSIS — Z79899 Other long term (current) drug therapy: Secondary | ICD-10-CM | POA: Insufficient documentation

## 2017-04-16 DIAGNOSIS — H538 Other visual disturbances: Secondary | ICD-10-CM

## 2017-04-16 LAB — POCT GLYCOSYLATED HEMOGLOBIN (HGB A1C): Hemoglobin A1C: 5.5

## 2017-04-16 NOTE — Patient Instructions (Signed)
Prevencin de la diabetes mellitus tipo2 (Preventing Type 2 Diabetes Mellitus) La diabetes tipo2 (diabetes mellitus tipo2) es una enfermedad a largo plazo (crnica) que afecta los niveles de azcar en la sangre (glucosa). Normalmente, una hormona llamada insulina estimula el ingreso de la glucosa en las clulas del cuerpo. Las clulas usan la glucosa para obtener energa. En la diabetes tipo2, puede presentarse uno de los siguientes problemas, o ambos:  El organismo no produce la cantidad suficiente de insulina.  El organismo no responde de manera adecuada a la insulina que produce (resistencia a la insulina). La resistencia a la insulina o la falta de esta hormona hace que el exceso de glucosa se acumule en la sangre, en lugar de ir a las clulas. Como consecuencia, se desarrolla glucemia alta (hiperglucemia), que puede causar muchas complicaciones. El sobrepeso o la obesidad, y llevar un estilo de vida inactivo (sedentario) pueden aumentar el riesgo de tener diabetes. La diabetes tipo2 se puede retardar o evitar al realizar ciertos cambios en la alimentacin y en el estilo de vida. QU CAMBIOS EN LA ALIMENTACIN SE PUEDEN HACER?  Consuma comidas y colaciones saludables regularmente. Lleve con usted una colacin saludable para cuando tenga hambre entre las comidas, por ejemplo, una fruta o un puado de frutos secos.  Coma carne magra y protenas con bajo contenido de grasas saturadas, como pollo, pescado, huevos blancos y frijoles. Evite las carnes procesadas.  Coma mucha fruta y verdura, y una cantidad importante de cereales no procesados (cereales integrales). Se recomienda que consuma lo siguiente: ? De 1 a 2tazas de frutas todos los das. ? De 2 a 3tazas de verduras todos los das. ? De 6onzas (170g) a 8onzas (227g) de cereales integrales todos los das, como avena, salvado, trigo bulgur, arroz integral, quinua y mijo.  Productos lcteos con bajo contenido de grasa, como  leche, yogur y queso.  Alimentos que contengan grasas saludables, como frutos secos, aguacate, aceite de oliva y aceite de canola.  Beba agua durante todo el da. Evite bebidas que contengan ms azcar, como gaseosas y t dulce.  Siga las indicaciones del mdico con respecto a las restricciones especficas para las comidas o bebidas.  Controle la cantidad de comida que consume en un momento dado (tamao de la porcin). ? Revise las etiquetas de los alimentos para conocer el tamao de la porcin. ? Utilice una balanza de cocina para pesar las cantidades de alimentos.  Saltee o cocine al vapor los alimentos en vez de frerlos. Cocine con agua o caldo en vez de aceite o manteca.  Limite la ingesta de lo siguiente: ? Sal (sodio). No consuma ms de 1cucharadita (2400mg) de sodio por da. Si tiene alguna cardiopata o hipertensin arterial, consuma menos de  o de cucharadita (1500mg) de sodio por da. ? Grasas saturadas. Es la grasa que se encuentra en estado slido a temperatura ambiente, como la manteca o la grasa de la carne. QU CAMBIOS EN EL ESTILO DE VIDA SE PUEDEN HACER? Actividad  Haga actividad fsica de intensidad moderada durante al menos 30minutos como mnimo 5das por semana, o tanto como le haya indicado el mdico.  Pregntele al mdico qu actividades son seguras para usted. Una combinacin de actividades puede ser la mejor opcin, por ejemplo, caminar, practicar natacin, andar en bicicleta y hacer entrenamiento de fuerza.  Trate de agregar la actividad fsica a su da. Por ejemplo: ? Estacione en lugares que estn ms alejados de lo habitual para poder caminar ms. Por ejemplo, estacione en una esquina   alejada del estacionamiento cuando vaya a la oficina o a la tienda de comestibles. ? D una caminata durante su hora de almuerzo. ? Utilice las escaleras en lugar del ascensor o de las escaleras mecnicas. Prdida de peso  Baje de peso segn se le indique. El mdico  puede determinar cuntos kilos tiene que bajar y ayudarlo a que adelgace de manera segura.  Si tiene sobrepeso u obesidad, es posible que se le indique bajar, por lo menos del 5% al 7% del peso corporal. Alcohol y tabaco  Limite el consumo de alcohol a no ms de 1 medida por da si es mujer y no est embarazada, y 2 medidas por da si es hombre. Una medida equivale a 12onzas de cerveza, 5onzas de vino o 1onzas de bebidas alcohlicas de alta graduacin.  No consuma ningn producto que contenga tabaco, lo que incluye cigarrillos, tabaco de mascar y cigarrillos electrnicos. Si necesita ayuda para dejar de fumar, consulte al mdico. Coopere con el mdico  Contrlese el nivel sanguneo de glucosa con frecuencia como se lo haya indicado el mdico.  Analice los factores de riesgo y cmo puede reducir el riesgo de tener diabetes.  Hgase las pruebas de deteccin como se lo haya indicado el mdico. Puede hacerse pruebas de deteccin de forma peridica, especialmente si presenta ciertos factores de riesgo para la diabetes tipo2.  Haga una cita con un especialista en alimentacin y nutricin (nutricionista certificado). Un nutricionista certificado puede ayudarlo a preparar un plan de alimentacin saludable, y a comprender los tamaos de las porciones y las etiquetas de los alimentos. POR QU ESTOS CAMBIOS SON IMPORTANTES?  Al hacer cambios en el estilo de vida y la alimentacin, es posible prevenir o retardar la diabetes tipo2 y los problemas de salud relacionados.  Puede ser difcil reconocer los signos de la diabetes tipo2. La mejor manera de evitar los posibles daos al organismo es tomar medidas para prevenir la enfermedad antes de presentar sntomas. QU PUEDE SUCEDER SI NO SE REALIZAN CAMBIOS?  Los niveles sanguneos de glucosa pueden seguir aumentando. Es peligroso tener la glucemia alta durante mucho tiempo. Demasiada glucosa en la sangre puede daar los vasos sanguneos, el  corazn, los riones, los nervios y los ojos.  Puede desarrollar prediabetes o diabetes tipo2. La diabetes tipo2 puede producir muchos problemas de salud crnicos y complicaciones, por ejemplo: ? Cardiopata. ? Ictus. ? Ceguera. ? Enfermedad renal. ? Depresin. ? Mala circulacin en los pies y en las piernas, que podra llevar a la extraccin quirrgica (amputacin) en casos graves. DNDE ENCONTRAR ASISTENCIA:  Pdale al mdico que le recomiende a un nutricionista certificado, a un instructor para el cuidado de la diabetes o un programa para bajar de peso.  Busque grupos para bajar de peso locales o en lnea.  Inscrbase en un gimnasio, club de preparacin fsica o grupo de actividades al aire libre, como un club para salir a caminar. DNDE ENCONTRAR MS INFORMACIN: Para obtener ms informacin sobre la diabetes y la prevencin de la diabetes, visite los siguientes sitios web:  Asociacin Americana de la Diabetes (American Diabetes Association, ADA): www.diabetes.org  Instituto Nacional de la Diabetes y las Enfermedades Digestivas y Renales (National Institute of Diabetes and Digestive and Kidney Diseases): www.niddk.nih.gov/health-information/diabetes Para obtener ms informacin sobre una alimentacin saludable, visite los siguientes sitios web:  Choose My Plate (MiPlato), Departamento de Agricultura de EE.UU. (U.S. Department of Agriculture, USDA): www.choosemyplate.gov/food-groups  [place] Dietary Guidelines (Pautas de alimentacin) de la Oficina de Prevencin de Enfermedades y Promocin de   la Salud (Office of Disease Prevention and Health Promotion, ODPHP): www.health.gov/dietaryguidelines Resumen  Puede reducir el riesgo de desarrollar diabetes tipo2 al aumentar la actividad fsica, comer alimentos saludables y bajar de peso, segn se le indique.  Hable con el mdico sobre el riesgo de desarrollar diabetes tipo2. Pregntele sobre los anlisis de sangre o las pruebas de  deteccin que deba hacerse. Esta informacin no tiene como fin reemplazar el consejo del mdico. Asegrese de hacerle al mdico cualquier pregunta que tenga. Document Released: 09/26/2015 Document Revised: 09/26/2015 Document Reviewed: 09/26/2015 Elsevier Interactive Patient Education  2018 Elsevier Inc.  

## 2017-04-16 NOTE — Progress Notes (Signed)
Patient is here for blood sugar check   Patient complains about blurry vision

## 2017-04-16 NOTE — Progress Notes (Signed)
Subjective:  Patient ID: Tanya BoxMaria Murgia, female    DOB: 1990/10/05  Age: 26 y.o. MRN: [id number]  CC: Establish Care   HPI Tanya Contreras presents for follow up for history of prediabetes.  Symptoms: visual disturbances. Symptoms have are unchanged. Patient denies foot ulcerations, nausea, paresthesia of the feet, vomitting and weight loss.  Evaluation to date has been included: hemoglobin A1C. Treatment to date: metformin. Patient reports not taking metformin in several months. She reports history of blurry vision with black dots. She reports never having an opthalmology exam.     Outpatient Medications Prior to Visit  Medication Sig Dispense Refill  . amoxicillin (AMOXIL) 500 MG capsule Take 1 capsule (500 mg total) by mouth 3 (three) times daily. (Patient not taking: Reported on 05/13/2016) 21 capsule 0  . HYDROcodone-acetaminophen (NORCO) 5-325 MG per tablet Take 1 tablet by mouth every 4 (four) hours as needed for severe pain. (Patient not taking: Reported on 05/13/2016) 20 tablet 0  . HYDROcodone-acetaminophen (NORCO/VICODIN) 5-325 MG per tablet Take 1-2 tablets by mouth every 4 (four) hours as needed for moderate pain. (Patient not taking: Reported on 05/13/2016) 30 tablet 0  . ibuprofen (ADVIL,MOTRIN) 600 MG tablet Take 1 tablet (600 mg total) by mouth every 8 (eight) hours as needed for moderate pain. Take with food 30 tablet 0  . metFORMIN (GLUCOPHAGE) 500 MG tablet Take 1 tablet (500 mg total) by mouth once. 30 tablet 3  . ondansetron (ZOFRAN ODT) 4 MG disintegrating tablet 4mg  ODT q4 hours prn nausea/vomit (Patient not taking: Reported on 05/13/2016) 8 tablet 0  . traMADol (ULTRAM) 50 MG tablet Take 1 tablet (50 mg total) by mouth every 8 (eight) hours as needed. (Patient not taking: Reported on 05/13/2016) 30 tablet 0   No facility-administered medications prior to visit.     ROS Review of Systems  Constitutional: Negative.   Eyes: Positive for visual disturbance.  Respiratory:  Negative.   Cardiovascular: Negative.   Gastrointestinal: Negative.   Endocrine: Negative.   Skin: Negative.   Neurological: Negative.    Objective:  BP 95/66 (BP Location: Left Arm, Patient Position: Sitting, Cuff Size: Normal)   Pulse 79   Temp 98.1 F (36.7 C) (Oral)   Resp 18   Ht 5\' 1"  (1.549 m)   Wt 140 lb 6.4 oz (63.7 kg)   SpO2 98%   BMI 26.53 kg/m   BP/Weight 04/16/2017 05/13/2016 02/03/2015  Systolic BP 95 100 103  Diastolic BP 66 68 73  Wt. (Lbs) 140.4 164.6 134.4  BMI 26.53 30.11 24.58   Physical Exam  Constitutional: She is oriented to person, place, and time. She appears well-developed and well-nourished.  Eyes: Pupils are equal, round, and reactive to light. Conjunctivae and EOM are normal.  Neck: No JVD present.  Cardiovascular: Normal rate, regular rhythm, normal heart sounds and intact distal pulses.   Pulmonary/Chest: Effort normal and breath sounds normal.  Abdominal: Soft. Bowel sounds are normal. There is no tenderness.  Neurological: She is alert and oriented to person, place, and time.  Skin: Skin is warm and dry.  Nursing note and vitals reviewed.  Assessment & Plan:   Problem List Items Addressed This Visit    None    Visit Diagnoses    Diabetes mellitus screening    -  Primary   Relevant Orders   HgB A1c (Completed)   Blurred vision, bilateral       Visual acuity screen   Relevant Orders   Ambulatory referral to  Ophthalmology   Floaters in visual field, bilateral       Visual acuity screen   Relevant Orders   Ambulatory referral to Ophthalmology      No orders of the defined types were placed in this encounter.   Follow-up: Return As needed.   Lizbeth Bark FNP

## 2017-12-04 ENCOUNTER — Ambulatory Visit: Payer: Self-pay

## 2018-01-23 ENCOUNTER — Ambulatory Visit: Payer: Self-pay

## 2020-04-10 ENCOUNTER — Other Ambulatory Visit: Payer: Self-pay

## 2020-04-10 ENCOUNTER — Ambulatory Visit: Payer: Self-pay | Admitting: Internal Medicine

## 2020-10-18 ENCOUNTER — Telehealth: Payer: Self-pay

## 2020-10-18 NOTE — Telephone Encounter (Signed)
Copied from CRM [id number]. Topic: Appointment Scheduling - Scheduling Inquiry for Clinic >> Oct 17, 2020  3:47 PM Randol Kern wrote: Reason for CRM: Pt would like to be scheduled to renew her orange card Best contact [phone]   Call placed to patient using interpreter services and LVM advising patient to call [phone] to discuss financial further.   If patient calls back, she has not been seen in 3 years and would be considered a new patient. Patient first needs to be scheduled as a new patient and once she is seen as a new patient we will give her the application for financial assistance so that she can apply. If she is approved for coverage the financial assistance will backdate and cover bills generated from the initial visit.

## 2021-01-24 ENCOUNTER — Other Ambulatory Visit: Payer: Self-pay

## 2021-01-24 ENCOUNTER — Ambulatory Visit: Payer: Self-pay | Attending: Nurse Practitioner | Admitting: Nurse Practitioner

## 2021-01-24 ENCOUNTER — Encounter: Payer: Self-pay | Admitting: Nurse Practitioner

## 2021-01-24 DIAGNOSIS — K089 Disorder of teeth and supporting structures, unspecified: Secondary | ICD-10-CM

## 2021-01-24 DIAGNOSIS — Z862 Personal history of diseases of the blood and blood-forming organs and certain disorders involving the immune mechanism: Secondary | ICD-10-CM

## 2021-01-24 DIAGNOSIS — Z1159 Encounter for screening for other viral diseases: Secondary | ICD-10-CM

## 2021-01-24 DIAGNOSIS — R7303 Prediabetes: Secondary | ICD-10-CM

## 2021-01-24 NOTE — Progress Notes (Signed)
Virtual Visit via Telephone Note Due to national recommendations of social distancing due to [place] 19, telehealth visit is felt to be most appropriate for this patient at this time.  I discussed the limitations, risks, security and privacy concerns of performing an evaluation and management service by telephone and the availability of in person appointments. I also discussed with the patient that there may be a patient responsible charge related to this service. The patient expressed understanding and agreed to proceed.    I connected with Tanya Contreras on 01/24/21  at   2:10 PM EDT  EDT by telephone and verified that I am speaking with the correct person using two identifiers.  Location of Patient: Private Residence   Location of Provider: [place] and [place] participating in Telemedicine visit: Geryl Rankins FNP-BC Candise Crabtree  Spanish Interpreter ID# [phone]   History of Present Illness: Telemedicine visit for: Re establish care  Patient has been counseled on age-appropriate routine health concerns for screening and prevention. These are reviewed and up-to-date. Referrals have been placed accordingly. Immunizations are up-to-date or declined.    PAP SMEAR: She declines PAP SMEAR to be scheduled. States she gets her pap smears at another clinic   She is requesting to re establish care so she can be referred to the dentist for major dental work. She has a tooth and the filling has come out. She has not applied for the financial assistance with the office at this time.   Denies chest pain, shortness of breath, palpitations, lightheadedness, dizziness, headaches or BLE edema.   Past Medical History:  Diagnosis Date  . Medical history non-contributory   . No significant past medical history   . Prediabetes     Past Surgical History:  Procedure Laterality Date  . CHOLECYSTECTOMY N/A 09/16/2014   Procedure: LAPAROSCOPIC CHOLECYSTECTOMY WITH  INTRAOPERATIVE CHOLANGIOGRAM;  Surgeon: Doreen Salvage, MD;  Location: [place] [facility];  Service: General;  Laterality: N/A;  . NO PAST SURGERIES      Family History  Problem Relation Age of Onset  . Alcohol abuse Neg Hx     Social History   Socioeconomic History  . Marital status: Single    Spouse name: Not on file  . Number of children: Not on file  . Years of education: Not on file  . Highest education level: Not on file  Occupational History  . Not on file  Tobacco Use  . Smoking status: Never Smoker  . Smokeless tobacco: Never Used  Substance and Sexual Activity  . Alcohol use: No  . Drug use: No  . Sexual activity: Yes  Other Topics Concern  . Not on file  Social History Narrative  . Not on file   Social Determinants of Health   Financial Resource Strain: Not on file  Food Insecurity: Not on file  Transportation Needs: Not on file  Physical Activity: Not on file  Stress: Not on file  Social Connections: Not on file     Observations/Objective: Awake, alert and oriented x 3   Review of Systems  Constitutional: Negative for fever, malaise/fatigue and weight loss.       Poor dentition  HENT: Negative.  Negative for nosebleeds.   Eyes: Negative.  Negative for blurred vision, double vision and photophobia.  Respiratory: Negative.  Negative for cough and shortness of breath.   Cardiovascular: Negative.  Negative for chest pain, palpitations and leg swelling.  Gastrointestinal: Negative.  Negative for heartburn, nausea and vomiting.  Musculoskeletal: Negative.  Negative for myalgias.  Neurological: Negative.  Negative for dizziness, focal weakness, seizures and headaches.  Psychiatric/Behavioral: Negative.  Negative for suicidal ideas.   Assessment and Plan: Diagnoses and all orders for this visit:  Prediabetes -     CMP14+EGFR; Future -     Hemoglobin A1c; Future -     Lipid panel; Future  Poor dentition -     Ambulatory referral to Dentistry  History of anemia -      CBC; Future  Need for hepatitis C screening test -     HCV Ab w Reflex to Quant PCR; Future     Follow Up Instructions Return if symptoms worsen or fail to improve.     I discussed the assessment and treatment plan with the patient. The patient was provided an opportunity to ask questions and all were answered. The patient agreed with the plan and demonstrated an understanding of the instructions.   The patient was advised to call back or seek an in-person evaluation if the symptoms worsen or if the condition fails to improve as anticipated.  I provided 12 minutes of non-face-to-face time during this encounter including median intraservice time, reviewing previous notes, labs, imaging, medications and explaining diagnosis and management.  Gildardo Pounds, FNP-BC

## 2021-01-29 ENCOUNTER — Other Ambulatory Visit: Payer: Self-pay

## 2021-01-29 ENCOUNTER — Ambulatory Visit: Payer: Self-pay | Attending: Family Medicine

## 2021-01-29 ENCOUNTER — Ambulatory Visit: Payer: Self-pay

## 2021-01-29 DIAGNOSIS — Z862 Personal history of diseases of the blood and blood-forming organs and certain disorders involving the immune mechanism: Secondary | ICD-10-CM

## 2021-01-29 DIAGNOSIS — Z1159 Encounter for screening for other viral diseases: Secondary | ICD-10-CM

## 2021-01-29 DIAGNOSIS — R7303 Prediabetes: Secondary | ICD-10-CM

## 2021-01-30 LAB — CMP14+EGFR
ALT: 20 IU/L (ref 0–32)
AST: 15 IU/L (ref 0–40)
Albumin/Globulin Ratio: 1.4 (ref 1.2–2.2)
Albumin: 4.2 g/dL (ref 3.8–4.8)
Alkaline Phosphatase: 77 IU/L (ref 44–121)
BUN/Creatinine Ratio: 16 (ref 9–23)
BUN: 10 mg/dL (ref 6–20)
Bilirubin Total: <0.2 mg/dL (ref 0.0–1.2)
CO2: 19 mmol/L — ABNORMAL LOW (ref 20–29)
Calcium: 9.5 mg/dL (ref 8.7–10.2)
Chloride: 104 mmol/L (ref 96–106)
Creatinine, Ser: 0.63 mg/dL (ref 0.57–1.00)
Globulin, Total: 3 g/dL (ref 1.5–4.5)
Glucose: 102 mg/dL — ABNORMAL HIGH (ref 65–99)
Potassium: 4.3 mmol/L (ref 3.5–5.2)
Sodium: 139 mmol/L (ref 134–144)
Total Protein: 7.2 g/dL (ref 6.0–8.5)
eGFR: 122 mL/min/{1.73_m2} (ref >59)

## 2021-01-30 LAB — CBC
Hematocrit: 39.7 % (ref 34.0–46.6)
Hemoglobin: 12.9 g/dL (ref 11.1–15.9)
MCH: 28.7 pg (ref 26.6–33.0)
MCHC: 32.5 g/dL (ref 31.5–35.7)
MCV: 88 fL (ref 79–97)
Platelets: 408 10*3/uL (ref 150–450)
RBC: 4.49 x10E6/uL (ref 3.77–5.28)
RDW: 14.1 % (ref 11.7–15.4)
WBC: 9.6 10*3/uL (ref 3.4–10.8)

## 2021-01-30 LAB — LIPID PANEL
Chol/HDL Ratio: 3.1 ratio (ref 0.0–4.4)
Cholesterol, Total: 157 mg/dL (ref 100–199)
HDL: 51 mg/dL (ref >39)
LDL Chol Calc (NIH): 90 mg/dL (ref 0–99)
Triglycerides: 82 mg/dL (ref 0–149)
VLDL Cholesterol Cal: 16 mg/dL (ref 5–40)

## 2021-01-30 LAB — HEMOGLOBIN A1C
Est. average glucose Bld gHb Est-mCnc: 117 mg/dL
Hgb A1c MFr Bld: 5.7 % — ABNORMAL HIGH (ref 4.8–5.6)

## 2021-01-30 LAB — HCV INTERPRETATION

## 2021-01-30 LAB — HCV AB W REFLEX TO QUANT PCR: HCV Ab: <0.1 s/co ratio (ref 0.0–0.9)

## 2021-02-16 ENCOUNTER — Telehealth: Payer: Self-pay

## 2021-02-16 NOTE — Telephone Encounter (Signed)
-----   Message from Claiborne Rigg, NP sent at 01/30/2021  8:08 AM EDT ----- CBC does not indicate any anemia or bleeding disorder.  Cholesterol levels are normal at this time.  Hepatitis C is negative.  A1c shows prediabetes.  At this time should work on reducing the amount of carbohydrates that you eat including pasta, rice, breads, tortillas, beans or anything fried or rolled in white flour

## 2021-02-16 NOTE — Telephone Encounter (Signed)
Patient name and DOB has been verified Patient was informed of lab results. Patient had no questions.  

## 2021-04-01 NOTE — Progress Notes (Deleted)
Established Patient Office Visit  Subjective:  Patient ID: Tanya Contreras, female    DOB: Dec 17, 1990  Age: 30 y.o. MRN: [id number]  CC: No chief complaint on file.   HPI Tanya Contreras presents for ***  Past Medical History:  Diagnosis Date   Medical history non-contributory    No significant past medical history    Prediabetes     Past Surgical History:  Procedure Laterality Date   CHOLECYSTECTOMY N/A 09/16/2014   Procedure: LAPAROSCOPIC CHOLECYSTECTOMY WITH INTRAOPERATIVE CHOLANGIOGRAM;  Surgeon: Doreen Salvage, MD;  Location: [facility] OR;  Service: General;  Laterality: N/A;   NO PAST SURGERIES      Family History  Problem Relation Age of Onset   Alcohol abuse Neg Hx     Social History   Socioeconomic History   Marital status: Single    Spouse name: Not on file   Number of children: Not on file   Years of education: Not on file   Highest education level: Not on file  Occupational History   Not on file  Tobacco Use   Smoking status: Never   Smokeless tobacco: Never  Substance and Sexual Activity   Alcohol use: No   Drug use: No   Sexual activity: Yes  Other Topics Concern   Not on file  Social History Narrative   Not on file   Social Determinants of Health   Financial Resource Strain: Not on file  Food Insecurity: Not on file  Transportation Needs: Not on file  Physical Activity: Not on file  Stress: Not on file  Social Connections: Not on file  Intimate Partner Violence: Not on file    No outpatient medications prior to visit.   No facility-administered medications prior to visit.    No Known Allergies  ROS Review of Systems    Objective:    Physical Exam  There were no vitals taken for this visit. Wt Readings from Last 3 Encounters:  04/16/17 140 lb 6.4 oz (63.7 kg)  05/13/16 164 lb 9.6 oz (74.7 kg)  02/03/15 134 lb 6.4 oz (61 kg)     Health Maintenance Due  Topic Date Due   Pneumococcal Vaccine 61-83 Years old (1 - PCV) Never done    PAP SMEAR-Modifier  07/04/2018   INFLUENZA VACCINE  03/19/2021    There are no preventive care reminders to display for this patient.  Lab Results  Component Value Date   TSH 1.09 05/13/2016   Lab Results  Component Value Date   WBC 9.6 01/29/2021   HGB 12.9 01/29/2021   HCT 39.7 01/29/2021   MCV 88 01/29/2021   PLT 408 01/29/2021   Lab Results  Component Value Date   NA 139 01/29/2021   K 4.3 01/29/2021   CO2 19 (L) 01/29/2021   GLUCOSE 102 (H) 01/29/2021   BUN 10 01/29/2021   CREATININE 0.63 01/29/2021   BILITOT <0.2 01/29/2021   ALKPHOS 77 01/29/2021   AST 15 01/29/2021   ALT 20 01/29/2021   PROT 7.2 01/29/2021   ALBUMIN 4.2 01/29/2021   CALCIUM 9.5 01/29/2021   ANIONGAP 7 09/15/2014   EGFR 122 01/29/2021   Lab Results  Component Value Date   CHOL 157 01/29/2021   Lab Results  Component Value Date   HDL 51 01/29/2021   Lab Results  Component Value Date   LDLCALC 90 01/29/2021   Lab Results  Component Value Date   TRIG 82 01/29/2021   Lab Results  Component Value Date  CHOLHDL 3.1 01/29/2021   Lab Results  Component Value Date   HGBA1C 5.7 (H) 01/29/2021      Assessment & Plan:   Problem List Items Addressed This Visit   None   No orders of the defined types were placed in this encounter.   Follow-up: No follow-ups on file.    Asencion Noble, MD

## 2021-04-02 ENCOUNTER — Other Ambulatory Visit: Payer: Self-pay

## 2021-04-02 ENCOUNTER — Ambulatory Visit: Payer: Self-pay | Attending: Critical Care Medicine | Admitting: Critical Care Medicine

## 2021-04-02 ENCOUNTER — Encounter: Payer: Self-pay | Admitting: Critical Care Medicine

## 2021-04-02 VITALS — BP 102/69 | HR 74 | Temp 98.6°F | Ht 62.0 in | Wt 160.6 lb

## 2021-04-02 DIAGNOSIS — L811 Chloasma: Secondary | ICD-10-CM | POA: Insufficient documentation

## 2021-04-02 MED ORDER — HYDROQUINONE 4 % EX CREA
TOPICAL_CREAM | Freq: Every day | CUTANEOUS | 1 refills | Status: AC
  Filled 2021-04-02: qty 28.35, 60d supply, fill #0

## 2021-04-02 NOTE — Assessment & Plan Note (Signed)
Patches of hyperpigmented skin on face are intact without erythema, swelling or breaks in skin. Patient is of [place] descent and reports spending lots of time out in he sun. Plan to treat conservatively with topical hydroquinone 4% cream and discussed protective sun barriers. Return to office for recheck in 4 weeks. If no improvement, plan to refer to dermatology. Patient has orange card.

## 2021-04-02 NOTE — Progress Notes (Addendum)
Acute Office Visit  Subjective:    Patient ID: Tanya Contreras, female    DOB: [birth year], 30 y.o.   MRN: [id number]  Chief Complaint  Patient presents with   black spots    On forehead and nose    HPI Spanish [phone] Patient is in today for an acute visit for "black spots on nose and face"   She recalls first noticing the hyperpigmentation around her nose, upper lip, forehead and right upper arm changing Tuesday of last week. She says since noticing them, they have not changed size or shape but has gotten lighter in color slightly. She has not put anything on her face to treat. Has been using an OTC facial cleanser for many years which hasn't changed. denies any new makeup, detergents or perfumes. She has a nevi on her left lower eyelid which she states has been present for many years and unchanged.  She says the areas of hyperpigmentation do not itch or cause pain. [place] but has been living in the [place] for 14 years. She regularly spends time outside in the sun but works in [employer].   Past Medical History:  Diagnosis Date   Medical history non-contributory    No significant past medical history    Prediabetes     Past Surgical History:  Procedure Laterality Date   CHOLECYSTECTOMY N/A 09/16/2014   Procedure: LAPAROSCOPIC CHOLECYSTECTOMY WITH INTRAOPERATIVE CHOLANGIOGRAM;  Surgeon: Frederik Schmidt, MD;  Location: [facility] OR;  Service: General;  Laterality: N/A;   NO PAST SURGERIES      Family History  Problem Relation Age of Onset   Alcohol abuse Neg Hx     Social History   Socioeconomic History   Marital status: Single    Spouse name: Not on file   Number of children: Not on file   Years of education: Not on file   Highest education level: Not on file  Occupational History   Not on file  Tobacco Use   Smoking status: Never   Smokeless tobacco: Never  Substance and Sexual Activity   Alcohol use: No   Drug use: No   Sexual activity: Yes  Other Topics  Concern   Not on file  Social History Narrative   Not on file   Social Determinants of Health   Financial Resource Strain: Not on file  Food Insecurity: Not on file  Transportation Needs: Not on file  Physical Activity: Not on file  Stress: Not on file  Social Connections: Not on file  Intimate Partner Violence: Not on file    No outpatient medications prior to visit.   No facility-administered medications prior to visit.    No Known Allergies  Review of Systems  Constitutional: Negative.   HENT: Negative.    Eyes: Negative.   Respiratory: Negative.    Cardiovascular: Negative.   Gastrointestinal: Negative.   Genitourinary: Negative.   Musculoskeletal: Negative.   Skin:  Positive for color change (on upper lip, forehead, nose and right upper arm). Negative for wound.  Allergic/Immunologic: Negative.  Negative for environmental allergies, food allergies and immunocompromised state.  Neurological: Negative.   Hematological: Negative.   Psychiatric/Behavioral: Negative.        Objective:    Physical Exam Constitutional:      Appearance: Normal appearance.  HENT:     Head: Normocephalic and atraumatic.     Mouth/Throat:     Mouth: Mucous membranes are moist.     Pharynx: Oropharynx is  clear.  Eyes:     Pupils: Pupils are equal, round, and reactive to light.  Cardiovascular:     Rate and Rhythm: Normal rate and regular rhythm.     Pulses: Normal pulses.     Heart sounds: Normal heart sounds.  Pulmonary:     Effort: Pulmonary effort is normal.     Breath sounds: Normal breath sounds.  Abdominal:     General: Bowel sounds are normal.     Palpations: Abdomen is soft.  Musculoskeletal:     Cervical back: Neck supple.  Skin:    General: Skin is warm and dry.     Findings: No acne, bruising, erythema, signs of injury, rash or wound.     Comments: Patch of intact but hyperpigmented skin to left upper lip, bridge of nose and across center of forehead along  hairline.  Neurological:     Mental Status: She is alert.    BP 102/69 (BP Location: Left Arm, Patient Position: Sitting, Cuff Size: Normal)   Pulse 74   Temp 98.6 F (37 C) (Oral)   Ht $R'5\' 2"'Yj$  (1.575 m)   Wt 160 lb 9.6 oz (72.8 kg)   SpO2 92%   Breastfeeding No   BMI 29.37 kg/m  Wt Readings from Last 3 Encounters:  04/02/21 160 lb 9.6 oz (72.8 kg)  04/16/17 140 lb 6.4 oz (63.7 kg)  05/13/16 164 lb 9.6 oz (74.7 kg)    Health Maintenance Due  Topic Date Due   Pneumococcal Vaccine 67-33 Years old (1 - PCV) Never done   PAP SMEAR-Modifier  07/04/2018   INFLUENZA VACCINE  03/19/2021    There are no preventive care reminders to display for this patient.   Lab Results  Component Value Date   TSH 1.09 05/13/2016   Lab Results  Component Value Date   WBC 9.6 01/29/2021   HGB 12.9 01/29/2021   HCT 39.7 01/29/2021   MCV 88 01/29/2021   PLT 408 01/29/2021   Lab Results  Component Value Date   NA 139 01/29/2021   K 4.3 01/29/2021   CO2 19 (L) 01/29/2021   GLUCOSE 102 (H) 01/29/2021   BUN 10 01/29/2021   CREATININE 0.63 01/29/2021   BILITOT <0.2 01/29/2021   ALKPHOS 77 01/29/2021   AST 15 01/29/2021   ALT 20 01/29/2021   PROT 7.2 01/29/2021   ALBUMIN 4.2 01/29/2021   CALCIUM 9.5 01/29/2021   ANIONGAP 7 09/15/2014   EGFR 122 01/29/2021   Lab Results  Component Value Date   CHOL 157 01/29/2021   Lab Results  Component Value Date   HDL 51 01/29/2021   Lab Results  Component Value Date   LDLCALC 90 01/29/2021   Lab Results  Component Value Date   TRIG 82 01/29/2021   Lab Results  Component Value Date   CHOLHDL 3.1 01/29/2021   Lab Results  Component Value Date   HGBA1C 5.7 (H) 01/29/2021       Assessment & Plan:   Problem List Items Addressed This Visit       Musculoskeletal and Integument   Melasma - Primary    Patches of hyperpigmented skin on face are intact without erythema, swelling or breaks in skin. Patient is of [place] descent  and reports spending lots of time out in he sun. Plan to treat conservatively with topical hydroquinone 4% cream and discussed protective sun barriers. Return to office for recheck in 4 weeks. If no improvement, plan to refer to dermatology. Patient  has orange card.         Meds ordered this encounter  Medications   hydroquinone 4 % cream    Sig: Apply topically daily. To affected areas of the face and upper arm    Dispense:  28.35 g    Refill:  1     Asencion Noble, MD

## 2021-04-02 NOTE — Patient Instructions (Signed)
Apply sunscreen to the forehead and face if you are out in direct sunlight it needs to be a 50 SPF or greater  Use the hydroquinone 4% cream applied at the darkened areas on the face once daily  Return to see your primary care provider Ms. Meredeth Ide again in 4 weeks for recheck we may yet need to send you to dermatology  See attachment as well  Aplique protector solar en la frente y la cara si est expuesto a la luz solar directa, debe tener un SPF de 50 o ms.  Use la crema de hidroquinona al 4% aplicada en las reas oscuras de la cara una vez al da.  Vuelva a ver a su proveedor de [employer], la Sra. Fleming, nuevamente en 4 semanas para que lo vuelvan a controlar. Es posible que an tengamos que enviarlo a Corporate investment banker.  Ver adjunto tambin

## 2021-04-03 ENCOUNTER — Other Ambulatory Visit: Payer: Self-pay

## 2021-04-10 ENCOUNTER — Other Ambulatory Visit: Payer: Self-pay
